# Patient Record
Sex: Female | Born: 1995
Health system: Southern US, Community
[De-identification: ages and names within clinical notes are randomized; demographics above are authoritative.]

## PROBLEM LIST (undated history)

## (undated) DIAGNOSIS — M1990 Unspecified osteoarthritis, unspecified site: Secondary | ICD-10-CM

## (undated) DIAGNOSIS — E785 Hyperlipidemia, unspecified: Secondary | ICD-10-CM

## (undated) DIAGNOSIS — F845 Asperger's syndrome: Secondary | ICD-10-CM

## (undated) DIAGNOSIS — N946 Dysmenorrhea, unspecified: Secondary | ICD-10-CM

## (undated) DIAGNOSIS — R519 Headache, unspecified: Secondary | ICD-10-CM

## (undated) DIAGNOSIS — R51 Headache: Secondary | ICD-10-CM

## (undated) DIAGNOSIS — L309 Dermatitis, unspecified: Secondary | ICD-10-CM

## (undated) DIAGNOSIS — N920 Excessive and frequent menstruation with regular cycle: Secondary | ICD-10-CM

## (undated) DIAGNOSIS — G2581 Restless legs syndrome: Secondary | ICD-10-CM

## (undated) HISTORY — DX: Unspecified osteoarthritis, unspecified site: M19.90

## (undated) HISTORY — DX: Excessive and frequent menstruation with regular cycle: N92.0

## (undated) HISTORY — DX: Headache, unspecified: R51.9

## (undated) HISTORY — DX: Dermatitis, unspecified: L30.9

## (undated) HISTORY — DX: Restless legs syndrome: G25.81

## (undated) HISTORY — DX: Asperger's syndrome: F84.5

## (undated) HISTORY — PX: TONSILECTOMY/ADENOIDECTOMY WITH MYRINGOTOMY: SHX6125

## (undated) HISTORY — DX: Dysmenorrhea, unspecified: N94.6

## (undated) HISTORY — DX: Headache: R51

## (undated) HISTORY — DX: Hyperlipidemia, unspecified: E78.5

---
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
## 2014-01-19 ENCOUNTER — Telehealth: Payer: Self-pay

## 2014-01-19 MED ORDER — NORETHIN-ETH ESTRAD-FE BIPHAS 1 MG-10 MCG / 10 MCG PO TABS: 1.0000 | ORAL_TABLET | Freq: Every day | ORAL | Status: DC

## 2014-01-19 NOTE — Telephone Encounter (Signed)
Received fax from [facility] re: refill on Lo Loestrin FE. Call to patient-scheduled AEX for 01/23/14 and sent 3 months refill so patient will have enough to get to appointment.//kn

## 2014-01-23 ENCOUNTER — Encounter: Payer: Self-pay | Admitting: Nurse Practitioner

## 2014-01-23 ENCOUNTER — Ambulatory Visit (INDEPENDENT_AMBULATORY_CARE_PROVIDER_SITE_OTHER): Payer: BC Managed Care – PPO | Admitting: Nurse Practitioner

## 2014-01-23 VITALS — BP 116/72 | HR 72 | Ht 63.75 in | Wt 212.0 lb

## 2014-01-23 DIAGNOSIS — Z01419 Encounter for gynecological examination (general) (routine) without abnormal findings: Secondary | ICD-10-CM

## 2014-01-23 DIAGNOSIS — R319 Hematuria, unspecified: Secondary | ICD-10-CM

## 2014-01-23 LAB — POCT URINALYSIS DIPSTICK
BILIRUBIN UA: NEGATIVE
GLUCOSE UA: NEGATIVE
Ketones, UA: NEGATIVE
Leukocytes, UA: NEGATIVE
NITRITE UA: NEGATIVE
Protein, UA: NEGATIVE
Urobilinogen, UA: NEGATIVE
pH, UA: 5

## 2014-01-23 MED ORDER — NORETHIN-ETH ESTRAD-FE BIPHAS 1 MG-10 MCG / 10 MCG PO TABS: 1.0000 | ORAL_TABLET | Freq: Every day | ORAL | Status: DC

## 2014-01-23 NOTE — Patient Instructions (Signed)
General topics  Next pap or exam is  due in 1 year Take a Women's multivitamin Take 1200 mg. of calcium daily - prefer dietary If any concerns in interim to call back  Breast Self-Awareness Practicing breast self-awareness may pick up problems early, prevent significant medical complications, and possibly save your life. By practicing breast self-awareness, you can become familiar with how your breasts look and feel and if your breasts are changing. This allows you to notice changes early. It can also offer you some reassurance that your breast health is good. One way to learn what is normal for your breasts and whether your breasts are changing is to do a breast self-exam. If you find a lump or something that was not present in the past, it is best to contact your caregiver right away. Other findings that should be evaluated by your caregiver include nipple discharge, especially if it is bloody; skin changes or reddening; areas where the skin seems to be pulled in (retracted); or new lumps and bumps. Breast pain is seldom associated with cancer (malignancy), but should also be evaluated by a caregiver. BREAST SELF-EXAM The best time to examine your breasts is 5 7 days after your menstrual period is over.  ExitCare Patient Information 2013 [employer].   Exercise to Stay Healthy Exercise helps you become and stay healthy. EXERCISE IDEAS AND TIPS Choose exercises that:  You enjoy.  Fit into your day. You do not need to exercise really hard to be healthy. You can do exercises at a slow or medium level and stay healthy. You can:  Stretch before and after working out.  Try yoga, Pilates, or tai chi.  Lift weights.  Walk fast, swim, jog, run, climb stairs, bicycle, dance, or rollerskate.  Take aerobic classes. Exercises that burn about 150 calories:  Running 1  miles in 15 minutes.  Playing volleyball for 45 to 60 minutes.  Washing and waxing a car for 45 to 60  minutes.  Playing touch football for 45 minutes.  Walking 1  miles in 35 minutes.  Pushing a stroller 1  miles in 30 minutes.  Playing basketball for 30 minutes.  Raking leaves for 30 minutes.  Bicycling 5 miles in 30 minutes.  Walking 2 miles in 30 minutes.  Dancing for 30 minutes.  Shoveling snow for 15 minutes.  Swimming laps for 20 minutes.  Walking up stairs for 15 minutes.  Bicycling 4 miles in 15 minutes.  Gardening for 30 to 45 minutes.  Jumping rope for 15 minutes.  Washing windows or floors for 45 to 60 minutes. Document Released: 05/24/2010 Document Revised: 07/14/2011 Document Reviewed: 05/24/2010 ExitCare Patient Information 2013 [employer].   Other topics ( that may be useful information):    Sexually Transmitted Disease Sexually transmitted disease (STD) refers to any infection that is passed from person to person during sexual activity. This may happen by way of saliva, semen, blood, vaginal mucus, or urine. Common STDs include:  Gonorrhea.  Chlamydia.  Syphilis.  HIV/AIDS.  Genital herpes.  Hepatitis B and C.  Trichomonas.  Human papillomavirus (HPV).  Pubic lice. CAUSES  An STD may be spread by bacteria, virus, or parasite. A person can get an STD by:  Sexual intercourse with an infected person.  Sharing sex toys with an infected person.  Sharing needles with an infected person.  Having intimate contact with the genitals, mouth, or rectal areas of an infected person. SYMPTOMS  Some people may not have any symptoms, but   they can still pass the infection to others. Different STDs have different symptoms. Symptoms include:  Painful or bloody urination.  Pain in the pelvis, abdomen, vagina, anus, throat, or eyes.  Skin rash, itching, irritation, growths, or sores (lesions). These usually occur in the genital or anal area.  Abnormal vaginal discharge.  Penile discharge in men.  Soft, flesh-colored skin growths in the  genital or anal area.  Fever.  Pain or bleeding during sexual intercourse.  Swollen glands in the groin area.  Yellow skin and eyes (jaundice). This is seen with hepatitis. DIAGNOSIS  To make a diagnosis, your caregiver may:  Take a medical history.  Perform a physical exam.  Take a specimen (culture) to be examined.  Examine a sample of discharge under a microscope.  Perform blood test TREATMENT   Chlamydia, gonorrhea, trichomonas, and syphilis can be cured with antibiotic medicine.  Genital herpes, hepatitis, and HIV can be treated, but not cured, with prescribed medicines. The medicines will lessen the symptoms.  Genital warts from HPV can be treated with medicine or by freezing, burning (electrocautery), or surgery. Warts may come back.  HPV is a virus and cannot be cured with medicine or surgery.However, abnormal areas may be followed very closely by your caregiver and may be removed from the cervix, vagina, or vulva through office procedures or surgery. If your diagnosis is confirmed, your recent sexual partners need treatment. This is true even if they are symptom-free or have a negative culture or evaluation. They should not have sex until their caregiver says it is okay. HOME CARE INSTRUCTIONS  All sexual partners should be informed, tested, and treated for all STDs.  Take your antibiotics as directed. Finish them even if you start to feel better.  Only take over-the-counter or prescription medicines for pain, discomfort, or fever as directed by your caregiver.  Rest.  Eat a balanced diet and drink enough fluids to keep your urine clear or pale yellow.  Do not have sex until treatment is completed and you have followed up with your caregiver. STDs should be checked after treatment.  Keep all follow-up appointments, Pap tests, and blood tests as directed by your caregiver.  Only use latex condoms and water-soluble lubricants during sexual activity. Do not use  petroleum jelly or oils.  Avoid alcohol and illegal drugs.  Get vaccinated for HPV and hepatitis. If you have not received these vaccines in the past, talk to your caregiver about whether one or both might be right for you.  Avoid risky sex practices that can break the skin. The only way to avoid getting an STD is to avoid all sexual activity.Latex condoms and dental dams (for oral sex) will help lessen the risk of getting an STD, but will not completely eliminate the risk. SEEK MEDICAL CARE IF:   You have a fever.  You have any new or worsening symptoms. Document Released: 07/12/2002 Document Revised: 07/14/2011 Document Reviewed: 07/19/2010 [facility] Patient Information 2013 [employer].    Domestic Abuse You are being battered or abused if someone close to you hits, pushes, or physically hurts you in any way. You also are being abused if you are forced into activities. You are being sexually abused if you are forced to have sexual contact of any kind. You are being emotionally abused if you are made to feel worthless or if you are constantly threatened. It is important to remember that help is available. No one has the right to abuse you. PREVENTION OF FURTHER  ABUSE  Learn the warning signs of danger. This varies with situations but may include: the use of alcohol, threats, isolation from friends and family, or forced sexual contact. Leave if you feel that violence is going to occur.  If you are attacked or beaten, report it to the police so the abuse is documented. You do not have to press charges. The police can protect you while you or the attackers are leaving. Get the officer's name and badge number and a copy of the report.  Find someone you can trust and tell them what is happening to you: your caregiver, a nurse, clergy member, close friend or family member. Feeling ashamed is natural, but remember that you have done nothing wrong. No one deserves abuse. Document Released:  04/18/2000 Document Revised: 07/14/2011 Document Reviewed: 06/27/2010 ExitCare Patient Information 2013 [employer].    How Much is Too Much Alcohol? Drinking too much alcohol can cause injury, accidents, and health problems. These types of problems can include:   Car crashes.  Falls.  Family fighting (domestic violence).  Drowning.  Fights.  Injuries.  Burns.  Damage to certain organs.  Having a baby with birth defects. ONE DRINK CAN BE TOO MUCH WHEN YOU ARE:  Working.  Pregnant or breastfeeding.  Taking medicines. Ask your doctor.  Driving or planning to drive. If you or someone you know has a drinking problem, get help from a doctor.  Document Released: 02/15/2009 Document Revised: 07/14/2011 Document Reviewed: 02/15/2009 ExitCare Patient Information 2013 [employer].   Smoking Hazards Smoking cigarettes is extremely bad for your health. Tobacco smoke has over 200 known poisons in it. There are over 60 chemicals in tobacco smoke that cause cancer. Some of the chemicals found in cigarette smoke include:   Cyanide.  Benzene.  Formaldehyde.  Methanol (wood alcohol).  Acetylene (fuel used in welding torches).  Ammonia. Cigarette smoke also contains the poisonous gases nitrogen oxide and carbon monoxide.  Cigarette smokers have an increased risk of many serious medical problems and Smoking causes approximately:  90% of all lung cancer deaths in men.  80% of all lung cancer deaths in women.  90% of deaths from chronic obstructive lung disease. Compared with nonsmokers, smoking increases the risk of:  Coronary heart disease by 2 to 4 times.  Stroke by 2 to 4 times.  Men developing lung cancer by 23 times.  Women developing lung cancer by 13 times.  Dying from chronic obstructive lung diseases by 12 times.  . Smoking is the most preventable cause of death and disease in our society.  WHY IS SMOKING ADDICTIVE?  Nicotine is the chemical  agent in tobacco that is capable of causing addiction or dependence.  When you smoke and inhale, nicotine is absorbed rapidly into the bloodstream through your lungs. Nicotine absorbed through the lungs is capable of creating a powerful addiction. Both inhaled and non-inhaled nicotine may be addictive.  Addiction studies of cigarettes and spit tobacco show that addiction to nicotine occurs mainly during the teen years, when young people begin using tobacco products. WHAT ARE THE BENEFITS OF QUITTING?  There are many health benefits to quitting smoking.   Likelihood of developing cancer and heart disease decreases. Health improvements are seen almost immediately.  Blood pressure, pulse rate, and breathing patterns start returning to normal soon after quitting. QUITTING SMOKING   American Lung Association - 1-800-LUNGUSA  American Cancer Society - 1-800-ACS-2345 Document Released: 05/29/2004 Document Revised: 07/14/2011 Document Reviewed: 01/31/2009 ExitCare Patient Information 2013 ExitCare[employer]   LLC.   Stress Management Stress is a state of physical or mental tension that often results from changes in your life or normal routine. Some common causes of stress are:  Death of a loved one.  Injuries or severe illnesses.  Getting fired or changing jobs.  Moving into a new home. Other causes may be:  Sexual problems.  Business or financial losses.  Taking on a large debt.  Regular conflict with someone at home or at work.  Constant tiredness from lack of sleep. It is not just bad things that are stressful. It may be stressful to:  Win the lottery.  Get married.  Buy a new car. The amount of stress that can be easily tolerated varies from person to person. Changes generally cause stress, regardless of the types of change. Too much stress can affect your health. It may lead to physical or emotional problems. Too little stress (boredom) may also become stressful. SUGGESTIONS TO  REDUCE STRESS:  Talk things over with your family and friends. It often is helpful to share your concerns and worries. If you feel your problem is serious, you may want to get help from a professional counselor.  Consider your problems one at a time instead of lumping them all together. Trying to take care of everything at once may seem impossible. List all the things you need to do and then start with the most important one. Set a goal to accomplish 2 or 3 things each day. If you expect to do too many in a single day you will naturally fail, causing you to feel even more stressed.  Do not use alcohol or drugs to relieve stress. Although you may feel better for a short time, they do not remove the problems that caused the stress. They can also be habit forming.  Exercise regularly - at least 3 times per week. Physical exercise can help to relieve that "uptight" feeling and will relax you.  The shortest distance between despair and hope is often a good night's sleep.  Go to bed and get up on time allowing yourself time for appointments without being rushed.  Take a short "time-out" period from any stressful situation that occurs during the day. Close your eyes and take some deep breaths. Starting with the muscles in your face, tense them, hold it for a few seconds, then relax. Repeat this with the muscles in your neck, shoulders, hand, stomach, back and legs.  Take good care of yourself. Eat a balanced diet and get plenty of rest.  Schedule time for having fun. Take a break from your daily routine to relax. HOME CARE INSTRUCTIONS   Call if you feel overwhelmed by your problems and feel you can no longer manage them on your own.  Return immediately if you feel like hurting yourself or someone else. Document Released: 10/15/2000 Document Revised: 07/14/2011 Document Reviewed: 06/07/2007 ExitCare Patient Information 2013 [employer]Care[employer].  

## 2014-01-23 NOTE — Progress Notes (Signed)
18 y.o. G0P0 Single Caucasian Fe here for annual exam. Usually has only spotting or no cycle on Lo Loestrin.  Some PMS and maybe slight cramps.  Not SA ever. Now on Mobic for plantar fascitis on right foot.  Patient's last menstrual period was 09/21/2013. occasionally a little spotting          Sexually active: No. never The current method of family planning is OCP (estrogen/progesterone).    Exercising: Yes.    Home exercise routine includes treadmill. Smoker:  no  Health Maintenance: Pap:  never TDaP:  Never will check with PCP Labs: 2013               UA: RBC-Trace, ph: 5.0   reports that she has never smoked. She has never used smokeless tobacco. She reports that she does not drink alcohol or use illicit drugs.  Past Medical History  Diagnosis Date  . Asperger's syndrome   . Menorrhagia   . Dysmenorrhea     Past Surgical History  Procedure Laterality Date  . Tonsilectomy/adenoidectomy with myringotomy    . Wisdom tooth extraction  09/02/12    Current Outpatient Prescriptions  Medication Sig Dispense Refill  . fexofenadine (ALLEGRA) 180 MG tablet Take 180 mg by mouth daily.        Marland Kitchen gabapentin (NEURONTIN) 600 MG tablet Take 600 mg by mouth 2 (two) times daily.       . Melatonin 5 MG TABS Take by mouth.      . meloxicam (MOBIC) 15 MG tablet Take 15 mg by mouth daily.      Marland Kitchen MINOCYCLINE HCL PO Take 50 mg by mouth daily.       . Multiple Vitamins-Minerals (MULTIVITAMIN WITH MINERALS) tablet Take 1 tablet by mouth daily.        . Norethindrone-Ethinyl Estradiol-Fe Biphas (LO LOESTRIN FE) 1 MG-10 MCG / 10 MCG tablet Take 1 tablet by mouth daily. Take continuous active pills.  4 Package  3  . Vilazodone HCl (VIIBRYD) 40 MG TABS Take 40 mg by mouth daily.       No current facility-administered medications for this visit.    Family History  Problem Relation Age of Onset  . Hypertension Father   . Thyroid disease Maternal Grandmother   . Depression Maternal Grandmother   .  Diabetes Maternal Grandfather   . CVA Maternal Grandfather   . Diabetes Paternal Grandmother   . Hypertension Paternal Grandmother   . Diabetes Paternal Grandfather   . Hypertension Paternal Grandfather   . Kidney Stones Mother   . Kidney Stones Sister     ROS:  Pertinent items are noted in HPI.  Otherwise, a comprehensive ROS was negative.  Exam:   BP 116/72  Pulse 72  Ht 5' 3.75" (1.619 m)  Wt 212 lb (96.163 kg)  BMI 36.69 kg/m2  LMP 09/21/2013 Height: 5' 3.75" (161.9 cm)  Ht Readings from Last 3 Encounters:  01/23/14 5' 3.75" (1.619 m)  12/20/12 5\' 4"  (1.626 m) (46%*, Z = -0.10)  02/10/11 5\' 4"  (1.626 m) (47%*, Z = -0.06)   * Growth percentiles are based on CDC 2-20 Years data.    General appearance: alert, cooperative and appears stated age Head: Normocephalic, without obvious abnormality, atraumatic Neck: no adenopathy, supple, symmetrical, trachea midline and thyroid normal to inspection and palpation Lungs: clear to auscultation bilaterally Breasts: normal appearance, no masses or tenderness Heart: regular rate and rhythm Abdomen: soft, non-tender; no masses,  no organomegaly Extremities: extremities normal, atraumatic,  no cyanosis or edema Skin: Skin color, texture, turgor normal. No rashes or lesions Lymph nodes: Cervical, supraclavicular, and axillary nodes normal. No abnormal inguinal nodes palpated Neurologic: Grossly normal   Pelvic: not indicated at this time  A:  Well Woman with normal exam  History of Asperger's Syndrome   History of Menorrhagia and Dysmenorrhea  Current amenorrhea on OCP   History of bad PMS and mood changes - debilitating but much better on OCP   Never Sexually active  Recent problems with right plantar fascitis   P:   Reviewed health and wellness pertinent to exam  Pap smear not taken today  Refill Lo Loestrin for a year  Counseled on breast self exam, use and side effects of OCP's, adequate intake of calcium and vitamin D,  diet and exercise return annually or prn  An After Visit Summary was printed and given to the patient.

## 2014-01-24 LAB — URINALYSIS, MICROSCOPIC ONLY
BACTERIA UA: NONE SEEN
CASTS: NONE SEEN
SQUAMOUS EPITHELIAL / LPF: NONE SEEN

## 2014-02-01 NOTE — Progress Notes (Signed)
Encounter reviewed by Dr. Brook Silva.  

## 2014-02-06 ENCOUNTER — Ambulatory Visit (INDEPENDENT_AMBULATORY_CARE_PROVIDER_SITE_OTHER): Payer: BC Managed Care – PPO | Admitting: Neurology

## 2014-02-06 ENCOUNTER — Encounter: Payer: Self-pay | Admitting: Neurology

## 2014-02-06 VITALS — BP 116/70 | HR 70 | Temp 98.3°F | Resp 16 | Ht 65.0 in | Wt 211.0 lb

## 2014-02-06 DIAGNOSIS — R42 Dizziness and giddiness: Secondary | ICD-10-CM

## 2014-02-06 DIAGNOSIS — H532 Diplopia: Secondary | ICD-10-CM

## 2014-02-06 DIAGNOSIS — R404 Transient alteration of awareness: Secondary | ICD-10-CM

## 2014-02-06 NOTE — Progress Notes (Signed)
NEUROLOGY CONSULTATION NOTE  Tina Wilcox MRN: [id number] DOB: 10-Jun-1995  Referring provider: Rowan Blase, PA-C Primary care provider: Sharilyn Sites, MD  Reason for consult:  Recurrent transient altered awareness, diplopia  HISTORY OF PRESENT ILLNESS: Tina Wilcox is a 18 year old right-handed woman with history of Asperger's syndrome, RLS, prior apnea, menorrhagia and dysmenorrhea who presents for dizziness.  Records reviewed.  For at least over a year, she began having spells, described as trouble focusing, memory problems, and aching in the shoulders.  On rare occasions, it is associated with left sided headache.  It is not associated with nausea or visual disturbance.  She always has phonophobia.  It lasts anywhere from 1 day to 1 week.  Frequency varies.  For the past 2 months, she began having similar spells while driving.  If she is driving for 40-98 minutes, she will develop a sensation of lightheadedness and sleepiness, associated with horizontal double vision.  It will occur until she is able to get out of the car and start moving around or interacting with people.  She does not lose awareness.  She also experiences episodes in the morning when she is half asleep and suddenly feels disoriented.  She denies episodes of loss of consciousness, seizure activity, cataplexy, or sleep paralysis.  She takes frequent naps but often has difficulty falling asleep at night.  She reports history of night terrors when she was a toddler.  She has a remote history of sleep apnea.  She denies history of seizures or head trauma.  Her mother and sister have history of migraines.  PAST MEDICAL HISTORY: Past Medical History  Diagnosis Date  . Asperger's syndrome   . Menorrhagia   . Dysmenorrhea   . Restless legs syndrome (RLS)   . Headache     PAST SURGICAL HISTORY: Past Surgical History  Procedure Laterality Date  . Tonsilectomy/adenoidectomy with myringotomy    . Wisdom tooth extraction   09/02/12    MEDICATIONS: Current Outpatient Prescriptions on File Prior to Visit  Medication Sig Dispense Refill  . fexofenadine (ALLEGRA) 180 MG tablet Take 180 mg by mouth daily.        Marland Kitchen gabapentin (NEURONTIN) 600 MG tablet Take 600 mg by mouth 2 (two) times daily.       . Melatonin 5 MG TABS Take by mouth.      . meloxicam (MOBIC) 15 MG tablet Take 15 mg by mouth daily.      Marland Kitchen MINOCYCLINE HCL PO Take 50 mg by mouth daily.       . Multiple Vitamins-Minerals (MULTIVITAMIN WITH MINERALS) tablet Take 1 tablet by mouth daily.        . Norethindrone-Ethinyl Estradiol-Fe Biphas (LO LOESTRIN FE) 1 MG-10 MCG / 10 MCG tablet Take 1 tablet by mouth daily. Take continuous active pills.  4 Package  3  . Vilazodone HCl (VIIBRYD) 40 MG TABS Take 40 mg by mouth daily.       No current facility-administered medications on file prior to visit.    ALLERGIES: No Known Allergies  FAMILY HISTORY: Family History  Problem Relation Age of Onset  . Hypertension Father   . Thyroid disease Maternal Grandmother   . Depression Maternal Grandmother   . Diabetes Maternal Grandfather   . CVA Maternal Grandfather   . Diabetes Paternal Grandmother   . Hypertension Paternal Grandmother   . Diabetes Paternal Grandfather   . Hypertension Paternal Grandfather   . Kidney Stones Mother   . Kidney Stones Sister  SOCIAL HISTORY: History   Social History  . Marital Status: Single    Spouse Name: Tina Wilcox    Number of Children: Tina Wilcox  . Years of Education: Tina Wilcox   Occupational History  . Not on file.   Social History Main Topics  . Smoking status: Never Smoker   . Smokeless tobacco: Never Used  . Alcohol Use: No  . Drug Use: No  . Sexual Activity: No   Other Topics Concern  . Not on file   Social History Narrative  . No narrative on file    REVIEW OF SYSTEMS: Constitutional: No fevers, chills, or sweats, no generalized fatigue, change in appetite Eyes: No visual changes, double vision, eye pain Ear,  nose and throat: No hearing loss, ear pain, nasal congestion, sore throat Cardiovascular: No chest pain, palpitations Respiratory:  No shortness of breath at rest or with exertion, wheezes GastrointestinaI: No nausea, vomiting, diarrhea, abdominal pain, fecal incontinence Genitourinary:  No dysuria, urinary retention or frequency Musculoskeletal:  No neck pain, back pain Integumentary: No rash, pruritus, skin lesions Neurological: as above Psychiatric: No depression, insomnia, anxiety Endocrine: No palpitations, fatigue, diaphoresis, mood swings, change in appetite, change in weight, increased thirst Hematologic/Lymphatic:  No anemia, purpura, petechiae. Allergic/Immunologic: no itchy/runny eyes, nasal congestion, recent allergic reactions, rashes  PHYSICAL EXAM: Filed Vitals:   02/06/14 1339  BP: 116/70  Pulse: 70  Temp: 98.3 F (36.8 C)  Resp: 16   General: No acute distress Head:  Normocephalic/atraumatic Neck: supple, no paraspinal tenderness, full range of motion Back: No paraspinal tenderness Heart: regular rate and rhythm Lungs: Clear to auscultation bilaterally. Vascular: No carotid bruits. Neurological Exam: Mental status: alert and oriented to person, place, and time, recent and remote memory intact, fund of knowledge intact, attention and concentration intact, speech fluent and not dysarthric, language intact. Cranial nerves: CN I: not tested CN II: pupils equal, round and reactive to light, visual fields intact, fundi unremarkable, without vessel changes, exudates, hemorrhages or papilledema. CN III, IV, VI:  full range of motion, no nystagmus, no ptosis CN V: facial sensation intact CN VII: upper and lower face symmetric CN VIII: hearing intact CN IX, X: gag intact, uvula midline CN XI: sternocleidomastoid and trapezius muscles intact CN XII: tongue midline Bulk & Tone: normal, no fasciculations. Motor: 5/5 throughout Sensation: temperature and vibration  intact Deep Tendon Reflexes: 2+ throughout, toes downgoing Finger to nose testing: no dysmetria Heel to shin: no dysmetria Gait: normal station and stride.  Able to turn and walk in tandem. Romberg negative.  IMPRESSION: Recurrent transient altered awareness Dizziness. Transient diplopia  Differential includes complex partial seizures or sleep disorder  PLAN: 1.  Will first check MRI of brain with and without contrast and EEG 2.  If unremarkable, will get sleep study  Thank you for allowing me to take part in the care of this patient.  Metta Clines, DO  CC:  Rowan Blase, PA-C  Sharilyn Sites, MD

## 2014-02-06 NOTE — Patient Instructions (Addendum)
1.  First, we will check MRI of the brain with and without contrast, and an EEG. MRI  @ [facility] 02/09/14 12:45 PM  2.  If these tests are unremarkable, then would send for a sleep study 3.  Follow up tentatively in 2 months or after testing done.

## 2014-02-09 ENCOUNTER — Ambulatory Visit (HOSPITAL_COMMUNITY)
Admission: RE | Admit: 2014-02-09 | Discharge: 2014-02-09 | Disposition: A | Discharge status: Home / Self Care | Payer: BC Managed Care – PPO | Source: Ambulatory Visit | Attending: Neurology | Admitting: Neurology

## 2014-02-09 DIAGNOSIS — R42 Dizziness and giddiness: Secondary | ICD-10-CM | POA: Diagnosis not present

## 2014-02-09 DIAGNOSIS — R404 Transient alteration of awareness: Secondary | ICD-10-CM

## 2014-02-09 DIAGNOSIS — H532 Diplopia: Secondary | ICD-10-CM | POA: Insufficient documentation

## 2014-02-09 MED ORDER — GADOBENATE DIMEGLUMINE 529 MG/ML IV SOLN
20.0000 mL | Freq: Once | INTRAVENOUS | Status: AC | PRN
  Administered 2014-02-09: 20 mL via INTRAVENOUS

## 2014-02-13 ENCOUNTER — Ambulatory Visit (INDEPENDENT_AMBULATORY_CARE_PROVIDER_SITE_OTHER): Payer: BC Managed Care – PPO | Admitting: Neurology

## 2014-02-13 DIAGNOSIS — R42 Dizziness and giddiness: Secondary | ICD-10-CM

## 2014-02-13 DIAGNOSIS — H532 Diplopia: Secondary | ICD-10-CM

## 2014-02-13 DIAGNOSIS — R404 Transient alteration of awareness: Secondary | ICD-10-CM

## 2014-02-14 NOTE — Procedures (Signed)
ELECTROENCEPHALOGRAM REPORT  Date of Study: 02/13/2014  Patient's Name: Tina Wilcox MRN: [id number] Date of Birth: October 11, 1995  Referring Provider: Metta Clines, DO  Indication: 18 year old woman with history of Asperger's Syndrome and restless leg syndrome who presents for recurrent episodes of transient altered awareness.  Medications: Gabapentin Melatonin vilazodone  Technical Summary: This is a multichannel digital EEG recording, using the international 10-20 placement system.  Spike detection software was employed.  Description: The EEG background is symmetric, with a well-developed posterior dominant rhythm of 11 Hz, which is reactive to eye opening and closing.  Diffuse beta activity is seen, with a bilateral frontal preponderance.  No focal or generalized abnormalities are seen.  No focal or generalized epileptiform discharges are seen.  Stage II sleep is seen, with normal and symmetric sleep patterns.  Hyperventilation and photic stimulation were performed, and produced no abnormalities.  ECG revealed normal cardiac rate and rhythm.  Impression: This is a normal routine EEG of the awake and asleep states, with activating procedures.  A normal study does not rule out the possibility of a seizure disorder in this patient.  Adam R. Tomi Likens, DO

## 2014-02-20 ENCOUNTER — Other Ambulatory Visit: Payer: Self-pay | Admitting: *Deleted

## 2014-02-20 DIAGNOSIS — R42 Dizziness and giddiness: Secondary | ICD-10-CM

## 2014-03-01 ENCOUNTER — Encounter: Payer: Self-pay | Admitting: Neurology

## 2014-03-01 ENCOUNTER — Ambulatory Visit (INDEPENDENT_AMBULATORY_CARE_PROVIDER_SITE_OTHER): Payer: BC Managed Care – PPO | Admitting: Neurology

## 2014-03-01 VITALS — BP 124/80 | HR 84 | Temp 98.0°F | Resp 14 | Ht 66.0 in | Wt 215.0 lb

## 2014-03-01 DIAGNOSIS — G4713 Recurrent hypersomnia: Secondary | ICD-10-CM

## 2014-03-01 DIAGNOSIS — G4701 Insomnia due to medical condition: Secondary | ICD-10-CM

## 2014-03-01 DIAGNOSIS — E669 Obesity, unspecified: Secondary | ICD-10-CM

## 2014-03-01 DIAGNOSIS — G4752 REM sleep behavior disorder: Secondary | ICD-10-CM

## 2014-03-01 DIAGNOSIS — F845 Asperger's syndrome: Secondary | ICD-10-CM

## 2014-03-01 DIAGNOSIS — G4759 Other parasomnia: Secondary | ICD-10-CM

## 2014-03-01 NOTE — Progress Notes (Signed)
NEUROLOGY CONSULTATION NOTE  Tina Wilcox MRN: [id number] DOB: 08/22/1995  Referring provider: Rowan Blase, PA-C Primary care provider: Sharilyn Sites, MD,  Pleas Koch, MD  Reason for consult:  Recurrent transient altered awareness, diplopia, abnormal sleep .  This patient of Dr.Jaffee's is referred for a sleep evaluation.   HISTORY OF PRESENT ILLNESS: Tina Wilcox is a 17 year old right-handed woman with history of Asperger's syndrome, RLS, prior apnea, menorrhagia and dysmenorrhea who presents for dizziness.   Tina Wilcox had been tested for RLS and mild sleep apnea in supine position. This study was obtained at age 25 , in [place].   Tina Wilcox and her mother report that Tina Wilcox had difficulties with sleep at a young age. She would have night terrors waking up searching for her parents of her right in front of her.  She was also at times insomnic- these seem to come in cycles or episodes. I called the patient, reports that she feels often as if not fully awake but no longer fully asleep. She is reporting having vivid dreams and ongoing story but she cannot truly concentrate on the details of the dream content as she feels too tired to do so.  Over this year her sleep has evolved : She seems not to be able to " get out of the dream"- I cannot focus on my dream and want to sleep. I feel frustrated by crazy and vivid, bizarre dreams that keep me from sleep. "  But she reports also , that she likes her dreams. She seems to drift off several times a day.  She reports that she naps most days, she did this while in school. But feels now it's a necessity. The naps can refresh her , most naps are lasting up to 2 hours. She has noted Bruxism and TMJ, snoring is reported .  Tina Wilcox goes to bed about midnight and sleeps until 8.30 AM. Sleep latency is usually 30 minutes, she takes melatonin. She avoids screen light in the bedroom.  Most nights she sleeps through until morning, no nocturia , no  confusional arousals, she finds herself in the same sleep position. She sleeps lateral and supine. She does not take caffeine. Takes regular breakfast , she is not exposed to a lot of daylight.  She is not an out of doors person.      Dr. Amaryllis Dyke note was reviewed:    For at least over a year, she began having spells, described as trouble focusing, memory problems, and aching in the shoulders.  On rare occasions, it is associated with left sided headache.  It is not associated with nausea or visual disturbance.  She always has phonophobia.  It lasts anywhere from 1 day to 1 week.  Frequency varies.  For the past 2 months, she began having similar spells while driving.  If she is driving for 33-00 minutes, she will develop a sensation of lightheadedness and sleepiness, associated with horizontal double vision.  It will occur until she is able to get out of the car and start moving around or interacting with people.  She does not lose awareness.  She also experiences episodes in the morning when she is half asleep and suddenly feels disoriented.  She denies episodes of loss of consciousness, seizure activity, cataplexy, or sleep paralysis.  She takes frequent naps but often has difficulty falling asleep at night.  She reports history of night terrors when she was a toddler.  She has a remote history  of sleep apnea.  She denies history of seizures or head trauma.  Her mother and sister have history of migraines.  PAST MEDICAL HISTORY: Past Medical History  Diagnosis Date  . Asperger's syndrome   . Menorrhagia   . Dysmenorrhea   . Restless legs syndrome (RLS)   . Headache     PAST SURGICAL HISTORY: Past Surgical History  Procedure Laterality Date  . Tonsilectomy/adenoidectomy with myringotomy    . Wisdom tooth extraction  09/02/12    MEDICATIONS: Current Outpatient Prescriptions on File Prior to Visit  Medication Sig Dispense Refill  . fexofenadine (ALLEGRA) 180 MG tablet Take 180 mg by  mouth daily.        Marland Kitchen gabapentin (NEURONTIN) 600 MG tablet Take 600 mg by mouth 2 (two) times daily.       . Melatonin 5 MG TABS Take 5 mg by mouth at bedtime. May take additional if needed.      Marland Kitchen MINOCYCLINE HCL PO Take 50 mg by mouth daily.       . Multiple Vitamins-Minerals (MULTIVITAMIN WITH MINERALS) tablet Take 1 tablet by mouth daily.        . Norethindrone-Ethinyl Estradiol-Fe Biphas (LO LOESTRIN FE) 1 MG-10 MCG / 10 MCG tablet Take 1 tablet by mouth daily. Take continuous active pills.  4 Package  3  . Vilazodone HCl (VIIBRYD) 40 MG TABS Take 40 mg by mouth daily.       No current facility-administered medications on file prior to visit.    ALLERGIES: No Known Allergies  FAMILY HISTORY: Family History  Problem Relation Age of Onset  . Hypertension Father   . Thyroid disease Maternal Grandmother   . Depression Maternal Grandmother   . Diabetes Maternal Grandfather   . CVA Maternal Grandfather   . Diabetes Paternal Grandmother   . Hypertension Paternal Grandmother   . Diabetes Paternal Grandfather   . Hypertension Paternal Grandfather   . Kidney Stones Mother   . Kidney Stones Sister     SOCIAL HISTORY: History   Social History  . Marital Status: Single    Spouse Name: N/A    Number of Children: N/A  . Years of Education: N/A   Occupational History  . Not on file.   Social History Main Topics  . Smoking status: Never Smoker   . Smokeless tobacco: Never Used  . Alcohol Use: No  . Drug Use: No  . Sexual Activity: No   Other Topics Concern  . Not on file   Social History Narrative   Right handed, Caffeine barely, HS grad.  Looking for work, Lives at home.      REVIEW OF SYSTEMS:  14 system : negative except for weight gain and insomnia.   PHYSICAL EXAM: Filed Vitals:   03/01/14 0930  BP: 124/80  Pulse: 84  Temp: 98 F (36.7 C)  Resp: 14   General: No acute distress Head:  Normocephalic/atraumatic Neck: supple, no paraspinal tenderness, full  range of motion,  No retrognathia ,   TMJ- wears a night guard.  Bruxism.  Neck circumference is  Back: No paraspinal tenderness Heart: regular rate and rhythm Lungs: Clear to auscultation bilaterally. Vascular: No carotid bruits. Neurological Exam: Mental status: alert and oriented to person, place, and time, recent and remote memory intact, fund of knowledge intact, attention and concentration intact, speech fluent and not dysarthric, language intact. Cranial nerves:  There appears to be  esotropia  (mild ) of the left eye with conjugate gaze, full extraocular  movements in the horizontal and vertical planes without nystagmus point prominent pupillary response to light. No pallor or edema noted. No facial asymmetry is noted, normal hearing, normal tongue and uvula midline movement.   Motor : equal tone and mass, no cog wheeling, normal strength.  Sensory intact to primary modalities.   Heel to shin/ finger to nose : no dysmetria Gait: normal station and stride.  Able to turn and walk in tandem.  Heel walk unsteady, but toe walk is normal, Romberg negative.  IMPRESSION:   1)episodic Insomnia and dream intrusion, obesity and large neck size- patient has risk factors for OSA. Her Epworth score of 15 is very high, her FSS is 47, elevated as well.  I am concerned about her report of dream intrusion.  Confusional arousals. Vivid dreams- these can be medication related.  20 RLS / PLM well managed on gabapentin.  3) Transient diplopia-  See report of EOM.   Differential includes complex partial seizures or sleep disorder.  PLAN: 1.  Will first check PSG with CO2, 2.  If unremarkable, will get  MSLT sleep study  Thank you for allowing me to take part in the care of this patient.  I will order a parasomnia PSG, SPLIT at  AHI 15 , 3%  Scored, CO2    CC:  Pleas Koch, MD  Rowan Blase, PA-C  Sharilyn Sites, MD

## 2014-04-04 ENCOUNTER — Telehealth: Payer: Self-pay | Admitting: Neurology

## 2014-04-04 NOTE — Telephone Encounter (Signed)
Pt called to cancel her f/u appt for 04/10/14. Pt wants to have her sleep study done first.

## 2014-04-10 ENCOUNTER — Ambulatory Visit: Payer: BC Managed Care – PPO | Admitting: Neurology

## 2014-04-16 ENCOUNTER — Ambulatory Visit (INDEPENDENT_AMBULATORY_CARE_PROVIDER_SITE_OTHER): Payer: BC Managed Care – PPO | Admitting: Neurology

## 2014-04-16 DIAGNOSIS — G4752 REM sleep behavior disorder: Secondary | ICD-10-CM

## 2014-04-16 DIAGNOSIS — G4759 Other parasomnia: Secondary | ICD-10-CM

## 2014-04-16 DIAGNOSIS — E669 Obesity, unspecified: Secondary | ICD-10-CM

## 2014-04-16 DIAGNOSIS — G4701 Insomnia due to medical condition: Secondary | ICD-10-CM

## 2014-04-16 DIAGNOSIS — F845 Asperger's syndrome: Secondary | ICD-10-CM

## 2014-04-16 DIAGNOSIS — G4713 Recurrent hypersomnia: Secondary | ICD-10-CM

## 2014-04-16 NOTE — Sleep Study (Signed)
Please see the scanned sleep study interpretation located in the Procedure tab within the Chart Review section. 

## 2014-04-25 ENCOUNTER — Encounter: Payer: Self-pay | Admitting: Neurology

## 2014-04-25 DIAGNOSIS — G4713 Recurrent hypersomnia: Secondary | ICD-10-CM | POA: Insufficient documentation

## 2014-04-26 ENCOUNTER — Encounter: Payer: Self-pay | Admitting: *Deleted

## 2014-04-26 ENCOUNTER — Telehealth: Payer: Self-pay | Admitting: *Deleted

## 2014-04-26 NOTE — Telephone Encounter (Signed)
Patient was contacted and provided the results of her NPSG and MSLT studies.  The patient gave verbal permission to mail a copy of her test results.  Copy of results were faxed to Dr. Pleas Koch.  Patient scheduled her follow-up appointment with Dr. Brett Fairy to go over treatment and results for 05/01/2014.

## 2014-05-01 ENCOUNTER — Ambulatory Visit (INDEPENDENT_AMBULATORY_CARE_PROVIDER_SITE_OTHER): Payer: BC Managed Care – PPO | Admitting: Neurology

## 2014-05-01 ENCOUNTER — Encounter: Payer: Self-pay | Admitting: Neurology

## 2014-05-01 VITALS — BP 113/76 | HR 71 | Resp 14 | Ht 66.0 in | Wt 218.0 lb

## 2014-05-01 DIAGNOSIS — F5105 Insomnia due to other mental disorder: Secondary | ICD-10-CM

## 2014-05-01 DIAGNOSIS — F419 Anxiety disorder, unspecified: Secondary | ICD-10-CM | POA: Insufficient documentation

## 2014-05-01 DIAGNOSIS — G4713 Recurrent hypersomnia: Secondary | ICD-10-CM | POA: Insufficient documentation

## 2014-05-01 DIAGNOSIS — G478 Other sleep disorders: Secondary | ICD-10-CM

## 2014-05-01 MED ORDER — MODAFINIL 100 MG PO TABS: 100.0000 mg | ORAL_TABLET | Freq: Every day | ORAL | Status: DC

## 2014-05-01 NOTE — Progress Notes (Signed)
NEUROLOGY CONSULTATION NOTE  Tina Wilcox MRN: [id number] DOB: 1995-05-18  Referring provider: Rowan Blase, PA-C Primary care provider: Sharilyn Sites, MD,  Tina Koch, MD  Reason for consult:  Recurrent transient altered awareness, diplopia, abnormal sleep .  This patient of Dr.Jaffee's is referred for a sleep evaluation.   HISTORY OF PRESENT ILLNESS: Tina Wilcox is a 18 year old right-handed woman with history of Asperger's syndrome, RLS, prior apnea, menorrhagia and dysmenorrhea who presents for dizziness.   MS. Tina Wilcox had been tested for RLS and mild sleep apnea in supine position. This study was obtained at age 74 , in [place].   Ms. Tina Wilcox and her mother report that Tina Wilcox had difficulties with sleep at a young age. She would have night terrors waking up searching for her parents of her right in front of her.  She was also at times insomnic- these seem to come in cycles or episodes. I called the patient, reports that she feels often as if not fully awake but no longer fully asleep. She is reporting having vivid dreams and ongoing story but she cannot truly concentrate on the details of the dream content as she feels too tired to do so.  Over this year her sleep has evolved : She seems not to be able to " get out of the dream"- I cannot focus on my dream and want to sleep. I feel frustrated by crazy and vivid, bizarre dreams that keep me from sleep. "  But she reports also , that she likes her dreams. She seems to drift off several times a day.  She reports that she naps most days, she did this while in school. But feels now it's a necessity. The naps can refresh her , most naps are lasting up to 2 hours. She has noted Bruxism and TMJ, snoring is reported .  Tina Wilcox goes to bed about midnight and sleeps until 8.30 AM. Sleep latency is usually 30 minutes, she takes melatonin. She avoids screen light in the bedroom.  Most nights she sleeps through until morning, no nocturia , no  confusional arousals, she finds herself in the same sleep position. She sleeps lateral and supine. She does not take caffeine. Takes regular breakfast , she is not exposed to a lot of daylight.  She is not an out of doors person.    05-01-14  The patient reports good success with Vybriid and has been more alert and less anxious. This may influence her sleep pattern, too.   Her sleep study was discussed- her PSG from  04-16-14 documented an AHI 4.6 and RDI of 7.2 . She has been sleepy all her life, her MSLT showed a mean sleep latency of 11 minutes and her REM latency was 210 minutes. Her MSLT was without any SREMs.  I will order a narcolepsy HLA, not change the antidepressants.  She want to drive and needs to take modafinil to be safe.   Dr. Amaryllis Wilcox note was reviewed:    For at least over a year, she began having spells, described as trouble focusing, memory problems, and aching in the shoulders.  Asperger's patient. On rare occasions, it is associated with left sided headache.  It is not associated with nausea or visual disturbance.  She always has phonophobia.  It lasts anywhere from 1 day to 1 week.  Frequency varies.  For the past 2 months, she began having similar spells while driving.  If she is driving for 09-62 minutes, she will develop  a sensation of lightheadedness and sleepiness, associated with horizontal double vision.  It will occur until she is able to get out of the car and start moving around or interacting with people.  She does not lose awareness.  She also experiences episodes in the morning when she is half asleep and suddenly feels disoriented.  She denies episodes of loss of consciousness, seizure activity, cataplexy, or sleep paralysis.  She takes frequent naps but often has difficulty falling asleep at night.  She reports history of night terrors when she was a toddler.  She has a remote history of sleep apnea.  She denies history of seizures or head trauma.  Her mother and  sister have history of migraines.  PAST MEDICAL HISTORY: Past Medical History  Diagnosis Date  . Asperger's syndrome   . Menorrhagia   . Dysmenorrhea   . Restless legs syndrome (RLS)   . Headache     PAST SURGICAL HISTORY: Past Surgical History  Procedure Laterality Date  . Tonsilectomy/adenoidectomy with myringotomy    . Wisdom tooth extraction  09/02/12    MEDICATIONS: Current Outpatient Prescriptions on File Prior to Visit  Medication Sig Dispense Refill  . cetirizine (ZYRTEC) 10 MG tablet Take 10 mg by mouth daily.    Marland Kitchen gabapentin (NEURONTIN) 600 MG tablet Take 600 mg by mouth 2 (two) times daily.     . Melatonin 5 MG TABS Take 5 mg by mouth at bedtime. May take additional if needed.    . Multiple Vitamins-Minerals (MULTIVITAMIN WITH MINERALS) tablet Take 1 tablet by mouth daily.      . Norethindrone-Ethinyl Estradiol-Fe Biphas (LO LOESTRIN FE) 1 MG-10 MCG / 10 MCG tablet Take 1 tablet by mouth daily. Take continuous active pills. 4 Package 3  . Vilazodone HCl (VIIBRYD) 40 MG TABS Take 40 mg by mouth daily.     No current facility-administered medications on file prior to visit.    ALLERGIES: No Known Allergies  FAMILY HISTORY: Family History  Problem Relation Age of Onset  . Hypertension Father   . Thyroid disease Maternal Grandmother   . Depression Maternal Grandmother   . Diabetes Maternal Grandfather   . CVA Maternal Grandfather   . Diabetes Paternal Grandmother   . Hypertension Paternal Grandmother   . Diabetes Paternal Grandfather   . Hypertension Paternal Grandfather   . Kidney Stones Mother   . Kidney Stones Sister     SOCIAL HISTORY: History   Social History  . Marital Status: Single    Spouse Name: N/A    Number of Children: N/A  . Years of Education: N/A   Occupational History  . Not on file.   Social History Main Topics  . Smoking status: Never Smoker   . Smokeless tobacco: Never Used  . Alcohol Use: No  . Drug Use: No  . Sexual  Activity: No   Other Topics Concern  . Not on file   Social History Narrative   Right handed, Caffeine barely, HS grad.  Looking for work, Lives at home.      REVIEW OF SYSTEMS:  14 system : negative except for weight gain and insomnia.   PHYSICAL EXAM: Filed Vitals:   05/01/14 0928  BP: 113/76  Pulse: 71  Resp: 14   General: No acute distress Head:  Normocephalic/atraumatic Neck: supple, no paraspinal tenderness, full range of motion,  No retrognathia ,   TMJ- wears a night guard.  Bruxism.  Neck circumference is  Back: No paraspinal tenderness Heart: regular  rate and rhythm Lungs: Clear to auscultation bilaterally. Vascular: No carotid bruits. Neurological Exam: Mental status: alert and oriented to person, place, and time, recent and remote memory intact, fund of knowledge intact, attention and concentration intact, speech fluent and not dysarthric, language intact. Cranial nerves:  There appears to be  esotropia  (mild ) of the left eye with conjugate gaze, full extraocular movements in the horizontal and vertical planes without nystagmus point prominent pupillary response to light. No pallor or edema noted. No facial asymmetry is noted, normal hearing, normal tongue and uvula midline movement.   Motor : equal tone and mass, no cog wheeling, normal strength.  Sensory intact to primary modalities.   Heel to shin/ finger to nose : no dysmetria Gait: normal station and stride.  Able to turn and walk in tandem.  Heel walk unsteady, but toe walk is normal, Romberg negative.  IMPRESSION:   1)episodic Insomnia and dream intrusion, obesity and large neck size- Her Epworth score of 13 is very high, her FSS is 47, elevated as well.  I am concerned about her report of dream intrusion. No OSA found.  Since her REM onset was delayed in the PSG and no SREMs seen in the MSLT. these can be medication related.   I will order the HLA test, and Modafinil would be my suggestion.  RV prn.        CC:  Tina Koch, MD  Tina Wilcox, [place], Tina Sites, MD

## 2014-05-01 NOTE — Patient Instructions (Signed)
Modafinil tablets  What is this medicine?  MODAFINIL (moe DAF i nil) is used to treat excessive sleepiness caused by certain sleep disorders. This includes narcolepsy, sleep apnea, and shift work sleep disorder.  This medicine may be used for other purposes; ask your health care provider or pharmacist if you have questions.  COMMON BRAND NAME(S): Provigil  What should I tell my health care provider before I take this medicine?  They need to know if you have any of these conditions:  -history of depression, mania, or other mental disorder  -kidney disease  -liver disease  -an unusual or allergic reaction to modafinil, other medicines, foods, dyes, or preservatives  -pregnant or trying to get pregnant  -breast-feeding  How should I use this medicine?  Take this medicine by mouth with a glass of water. Follow the directions on the prescription label. Take your doses at regular intervals. Do not take your medicine more often than directed. Do not stop taking except on your doctor's advice.  A special MedGuide will be given to you by the pharmacist with each prescription and refill. Be sure to read this information carefully each time.  Talk to your pediatrician regarding the use of this medicine in children. This medicine is not approved for use in children.  Overdosage: If you think you have taken too much of this medicine contact a poison control center or emergency room at once.  NOTE: This medicine is only for you. Do not share this medicine with others.  What if I miss a dose?  If you miss a dose, take it as soon as you can. If it is almost time for your next dose, take only that dose. Do not take double or extra doses.  What may interact with this medicine?  Do not take this medicine with any of the following medications:  -amphetamine or dextroamphetamine  -dexmethylphenidate or methylphenidate  -medicines called MAO Inhibitors like Nardil, Parnate, Marplan, Eldepryl  -pemoline  -procarbazine  This medicine may  also interact with the following medications:  -antifungal medicines like itraconazole or ketoconazole  -barbiturates like phenobarbital  -birth control pills or other hormone-containing birth control devices or implants  -carbamazepine  -cyclosporine  -diazepam  -medicines for depression, anxiety, or psychotic disturbances  -phenytoin  -propranolol  -triazolam  -warfarin  This list may not describe all possible interactions. Give your health care provider a list of all the medicines, herbs, non-prescription drugs, or dietary supplements you use. Also tell them if you smoke, drink alcohol, or use illegal drugs. Some items may interact with your medicine.  What should I watch for while using this medicine?  Visit your doctor or health care professional for regular checks on your progress. The full effects of this medicine may not be seen right away.  This medicine may affect your concentration, function, or may hide signs that you are tired. You may get dizzy. Do not drive, use machinery, or do anything that needs mental alertness until you know how this drug affects you. Alcohol can make you more dizzy and may interfere with your response to this medicine or your alertness. Avoid alcoholic drinks.  Birth control pills may not work properly while you are taking this medicine. Talk to your doctor about using an extra method of birth control.  It is unknown if the effects of this medicine will be increased by the use of caffeine. Caffeine is available in many foods, beverages, and medications. Ask your doctor if you should limit or   swelling of the face, lips, or tongue -anxiety -breathing problems -chest pain -fast, irregular  heartbeat -hallucinations -increased blood pressure -redness, blistering, peeling or loosening of the skin, including inside the mouth -sore throat, fever, or chills -suicidal thoughts or other mood changes -tremors -vomiting Side effects that usually do not require medical attention (report to your doctor or health care professional if they continue or are bothersome): -headache -nausea, diarrhea, or stomach upset -nervousness -trouble sleeping This list may not describe all possible side effects. Call your doctor for medical advice about side effects. You may report side effects to FDA at 1-800-FDA-1088. Where should I keep my medicine? Keep out of the reach of children. This medicine can be abused. Keep your medicine in a safe place to protect it from theft. Do not share this medicine with anyone. Selling or giving away this medicine is dangerous and against the law. Store at room temperature between 20 and 25 degrees C (68 and 77 degrees F). Throw away any unused medicine after the expiration date. NOTE: This sheet is a summary. It may not cover all possible information. If you have questions about this medicine, talk to your doctor, pharmacist, or health care provider.  2015, Elsevier/Gold Standard. (2009-04-03 21:07:42) Hypersomnia Hypersomnia usually brings recurrent episodes of excessive daytime sleepiness or prolonged nighttime sleep. It is different than feeling tired due to lack of or interrupted sleep at night. People with hypersomnia are compelled to nap repeatedly during the day. This is often at inappropriate times such as:  At work.  During a meal.  In conversation. These daytime naps usually provide no relief. This disorder typically affects adolescents and young adults. CAUSES  This condition may be caused by:  Another sleep disorder (such as narcolepsy or sleep apnea).  Dysfunction of the autonomic nervous system.  Drug or alcohol abuse.  A physical problem,  such as:  A tumor.  Head trauma. This is damage caused by an accident.  Injury to the central nervous system.  Certain medications, or medicine withdrawal.  Medical conditions may contribute to the disorder, including:  Multiple sclerosis.  Depression.  Encephalitis.  Epilepsy.  Obesity.  Some people appear to have a genetic predisposition to this disorder. In others, there is no known cause. SYMPTOMS   Patients often have difficulty waking from a long sleep. They may feel dazed or confused.  Other symptoms may include:  Anxiety.  Increased irritation (inflammation).  Decreased energy.  Restlessness.  Slow thinking.  Slow speech.  Loss of appetite.  Hallucinations.  Memory difficulty.  Tremors, Tics.  Some patients lose the ability to function in family, social, occupational, or other settings. TREATMENT  Treatment is symptomatic in nature. Stimulants and other drugs may be used to treat this disorder. Changes in behavior may help. For example, avoid night work and social activities that delay bed time. Changes in diet may offer some relief. Patients should avoid alcohol and caffeine. PROGNOSIS  The likely outcome (prognosis) for persons with hypersomnia depends on the cause of the disorder. The disorder itself is not life threatening. But it can have serious consequences. For example, automobile accidents can be caused by falling asleep while driving. The attacks usually continue indefinitely. Document Released: 04/11/2002 Document Revised: 07/14/2011 Document Reviewed: 03/15/2008 [facility] Patient Information 2015 [place], [place]. This information is not intended to replace advice given to you by your health care provider. Make sure you discuss any questions you have with your health care provider.

## 2014-05-04 ENCOUNTER — Telehealth: Payer: Self-pay | Admitting: Neurology

## 2014-05-04 NOTE — Telephone Encounter (Signed)
Patient's mother, Lelan Pons, stated prior authorization is needed for Rx modafinil (PROVIGIL) 100 MG tablet.  Please call and advise.

## 2014-05-04 NOTE — Telephone Encounter (Signed)
Ins has been contacted, and all clinical info was provided.  Request is currently under review.  I called back to advise.  They are aware.  Mom says the patient's insurance is changing to a different plan tomorrow, so we may have to initiate an additional request for new plan.  She will call us back and let us know if this needs to be done.

## 2014-05-08 ENCOUNTER — Telehealth: Payer: Self-pay | Admitting: Neurology

## 2014-05-08 NOTE — Telephone Encounter (Signed)
I contacted the pharmacy to get updated ins info.  The new info is as follows: BIN [id number] ID: [id number] Group: [id number] Phone [phone] I have contacted ins and provided all required clinical info.  Request is currently pending.  They will notify patient of outcome once decision has been made.  I called back.  They are aware.

## 2014-05-08 NOTE — Telephone Encounter (Signed)
Patient's mother stated prior authorization is needed for Rx modafinil (PROVIGIL) 100 MG tablet due to new [employer].  Please call and advise.

## 2014-05-11 ENCOUNTER — Telehealth: Payer: Self-pay | Admitting: *Deleted

## 2014-05-11 NOTE — Telephone Encounter (Signed)
I called and LMVM for her on cell# that results of lab was normal/negative.  She is to call back if questions.

## 2014-06-26 ENCOUNTER — Ambulatory Visit (INDEPENDENT_AMBULATORY_CARE_PROVIDER_SITE_OTHER): Payer: 59 | Admitting: Neurology

## 2014-06-26 ENCOUNTER — Encounter: Payer: Self-pay | Admitting: Neurology

## 2014-06-26 VITALS — BP 106/64 | HR 78 | Resp 16 | Ht 65.0 in | Wt 204.0 lb

## 2014-06-26 DIAGNOSIS — F32A Depression, unspecified: Secondary | ICD-10-CM | POA: Insufficient documentation

## 2014-06-26 DIAGNOSIS — F329 Major depressive disorder, single episode, unspecified: Secondary | ICD-10-CM

## 2014-06-26 DIAGNOSIS — R5383 Other fatigue: Secondary | ICD-10-CM

## 2014-06-26 NOTE — Progress Notes (Signed)
NEUROLOGY CONSULTATION NOTE  Tina Wilcox MRN: [id number] DOB: Feb 25, 1996  Referring provider: Rowan Blase, PA-C Primary care provider: Sharilyn Sites, MD,  Pleas Koch, DO neurologist at [facility]   Reason for consult:  Recurrent transient altered awareness, diplopia, abnormal sleep .  This patient of Dr.Jaffee's is referred for a sleep evaluation.   HISTORY OF PRESENT ILLNESS: Tina Wilcox is a 19 year old right-handed woman with history of Asperger's syndrome, RLS, prior apnea, menorrhagia and dysmenorrhea who presents for dizziness.   Tina Wilcox had been tested for RLS and mild sleep apnea in supine position. This study was obtained at age 89 , in [place].   Tina Wilcox and her mother report that Tina Wilcox had difficulties with sleep at a young age. She would have night terrors waking up searching for her parents of her right in front of her.  She was also at times insomnic- these seem to come in cycles or episodes. I called the patient, reports that she feels often as if not fully awake but no longer fully asleep. She is reporting having vivid dreams and ongoing story but she cannot truly concentrate on the details of the dream content as she feels too tired to do so.  Over this year her sleep has evolved : She seems not to be able to " get out of the dream"- I cannot focus on my dream and want to sleep. I feel frustrated by crazy and vivid, bizarre dreams that keep me from sleep. "  But she reports also , that she likes her dreams. She seems to drift off several times a day.  She reports that she naps most days, she did this while in school. But feels now it's a necessity. The naps can refresh her , most naps are lasting up to 2 hours. She has noted Bruxism and TMJ, snoring is reported .  Tina Wilcox goes to bed about midnight and sleeps until 8.30 AM. Sleep latency is usually 30 minutes, she takes melatonin. She avoids screen light in the bedroom.  Most nights she sleeps through until  morning, no nocturia , no confusional arousals, she finds herself in the same sleep position. She sleeps lateral and supine. She does not take caffeine. Takes regular breakfast , she is not exposed to a lot of daylight.  She is not an out of doors person.    05-01-14  The patient reports good success with Vybriid and has been more alert and less anxious. This may influence her sleep pattern, too.   Her sleep study was discussed- her PSG from  04-16-14 documented an AHI 4.6 and RDI of 7.2 . She has been sleepy all her life, her MSLT showed a mean sleep latency of 11 minutes and her REM latency was 210 minutes. Her MSLT was without any SREMs.  I will order a narcolepsy HLA, not change the antidepressants.  She want to drive and needs to take a stimulant modafinil to be safe.   06-26-14 Tina Wilcox is seen here today for a follow-up visit unfortunately the Provigil preparation at 100 mg daily did provoke headaches and also an anxiety / panic attack, she felt" a sense of doom". She had those before and was on Vibryd for this symptom. She felt hypervigilant at night, over 12 hors after taking it. She stopped.  She may tolerate a 50 mg dose, but unlikely to get the desired effect . HLA was negative , too. gabapentin is still used for restless legs.  8 PM and 12.00 midnight.   eliminating her morning dose helped with daytime sleepiness. The change to night time dose helps her night time sleep.   I see no other option for stimulant use in this patient with psychiatric overlay.     MEDICATIONS: Current Outpatient Prescriptions on File Prior to Visit  Medication Sig Dispense Refill  . cetirizine (ZYRTEC) 10 MG tablet Take 10 mg by mouth daily.    Marland Kitchen gabapentin (NEURONTIN) 600 MG tablet Take 600 mg by mouth 2 (two) times daily.     . Melatonin 5 MG TABS Take 5 mg by mouth at bedtime. May take additional if needed.    . modafinil (PROVIGIL) 100 MG tablet Take 1 tablet (100 mg total) by mouth daily.  30 tablet 1  . Multiple Vitamins-Minerals (MULTIVITAMIN WITH MINERALS) tablet Take 1 tablet by mouth daily.      . Norethindrone-Ethinyl Estradiol-Fe Biphas (LO LOESTRIN FE) 1 MG-10 MCG / 10 MCG tablet Take 1 tablet by mouth daily. Take continuous active pills. 4 Package 3  . Vilazodone HCl (VIIBRYD) 40 MG TABS Take 40 mg by mouth daily.    Marland Kitchen sulfamethoxazole-trimethoprim (BACTRIM,SEPTRA) 400-80 MG per tablet   1   No current facility-administered medications on file prior to visit.    ALLERGIES: No Known Allergies  FAMILY HISTORY: Family History  Problem Relation Age of Onset  . Hypertension Father   . Thyroid disease Maternal Grandmother   . Depression Maternal Grandmother   . Diabetes Maternal Grandfather   . CVA Maternal Grandfather   . Diabetes Paternal Grandmother   . Hypertension Paternal Grandmother   . Diabetes Paternal Grandfather   . Hypertension Paternal Grandfather   . Kidney Stones Mother   . Kidney Stones Sister     SOCIAL HISTORY: History   Social History  . Marital Status: Single    Spouse Name: N/A  . Number of Children: N/A  . Years of Education: N/A   Occupational History  . Not on file.   Social History Main Topics  . Smoking status: Never Smoker   . Smokeless tobacco: Never Used  . Alcohol Use: No  . Drug Use: No  . Sexual Activity: No   Other Topics Concern  . Not on file   Social History Narrative   Right handed, Caffeine barely, HS grad.  Looking for work, Lives at home.      REVIEW OF SYSTEMS:  14 system : negative except for weight gain and insomnia.   PHYSICAL EXAM: Filed Vitals:   06/26/14 1059  BP: 106/64  Pulse: 78  Resp: 16   General: No acute distress Head:  Normocephalic/atraumatic Neck: supple, no paraspinal tenderness, full range of motion,  No retrognathia ,   TMJ- wears a night guard.  Bruxism.  Neck circumference is  Back: No paraspinal tenderness Heart: regular rate and rhythm Lungs: Clear to auscultation  bilaterally. Vascular: No carotid bruits. Neurological Exam: Mental status: alert and oriented to person, place, and time, recent and remote memory intact, fund of knowledge intact, attention and concentration intact, speech fluent and not dysarthric, language intact. Cranial nerves:  There appears to be  esotropia  (mild ) of the left eye with conjugate gaze, full extraocular movements in the horizontal and vertical planes without nystagmus point prominent pupillary response to light. No pallor or edema noted. No facial asymmetry is noted, normal hearing, normal tongue and uvula midline movement.   Motor : equal tone and mass, no cog wheeling, normal strength.  Sensory intact to primary modalities.   Heel to shin/ finger to nose : no dysmetria Gait: normal station and stride.  Able to turn and walk in tandem.  Heel walk unsteady, but toe walk is normal, Romberg negative.  IMPRESSION:   1)episodic Insomnia and dream intrusion, obesity and large neck size- Her Epworth score of 13 is very high, her FSS is 47, elevated as well.  I am concerned about her report of dream intrusion. No OSA found.  Since her REM onset was delayed in the PSG and no SREMs seen in the MSLT. these can be medication related.   I will order the HLA test, and Modafinil would be my suggestion.  RV prn.       CC:  Pleas Koch, MD  Rowan Blase, [place], Sharilyn Sites, MD

## 2014-06-26 NOTE — Patient Instructions (Signed)
Autism Spectrum Disorder Autism spectrum disorder (ASD) is a neurodevelopmental disorder that starts in early childhood and is a lifelong problem. It is recognized by early onset of challenges a person has with communication, social interactions, and certain types of repeated behaviors. This disorder is also recognized by the effect these challenges have on daily activities. People living with ASD may also have other challenges, such as learning disabilities. Autism spectrum disorder usually becomes noticeable during early childhood. Some aspects of ASD are noticeable when a child is 75-12 months old. Most often, the challenges associated with this disorder are seen between the child's first and second birthdays (86-24 months old). The first signs of ASD are often seen by family members or health care providers. These signs are slow language development and a lack of interest in interacting with other people. Often after the child's first birthday, other aspects of ASD become noticeable. These include certain repeated behaviors and lack of typical play for the child's age. In most cases, people with ASD continue to learn how to cope with their disorder as they grow older.  SIGNS AND SYMPTOMS  There can be many different signs and symptoms of ASD, including:  Challenges with social communication and interaction with others.  Lack of interaction with other people.  Unusual approaches to interacting with people.  Lack of initiating (starting) social interactions with other people.  Lack of desire or ability to maintain eye contact with other people.  Lack of appropriate facial expressions.  Lack of appropriate body language while interacting with others.  Difficulty adjusting behavior when the situation calls for it.  Difficulties sharing imaginative play with others.  Difficulty making friends.  Repeated behaviors, interests, or activities.  Repeated movements like hand flapping, rocking  back and forth, or repeated head movements.  Often arranging items in an order that he or she desires or finds comforting.  Echoing what other people say.  Always wanting things to be the same, such as eating the same foods, taking the same route to school or work, or following the same order of activities each day.  Unusually strong attention to one thing or topic of interest.  Unusually strong or mild responses to experiences such as pain or extreme temperatures, touching certain textures, or smelling certain scents. Autism spectrum disorder occurs at different levels:  Level 1 is the least severe form of ASD. When supportive treatments are in place, most aspects of level 1 are difficult to notice. People at this level:  May speak in full sentences.  Usually have no repetitive behaviors.  May have trouble switching between activities.  May have trouble starting interactions or friendships with others.  Level 2 is a moderate form of ASD. At this level, challenges may be seen even when supportive treatments are in place. People at this level:  Speak in simple sentences.  Have difficulty coping with change.  Only interact with others around specific, shared interests.  Have unusual nonverbal communication skills.  Have repeated behaviors that sometimes interfere with daily activities.  Level 3 is the most severe form of ASD. Challenges at this level can interfere with daily life even when supportive treatments are in place. People at this level may:  Speak in very few understandable words.  Interact with others awkwardly and not very often.  Have trouble coping with change.  Have repeated behaviors that occur often and get in the way of their daily activities. Depending on the level of severity, some people are able to lead  normal or near-normal lives. Adolescence can worsen behavior problems in some children. Teenagers with ASD may become depressed. Some children develop  convulsions (seizures). People with ASD have a normal life span. DIAGNOSIS  The diagnosis of ASD is often a two-stage process. The first stage may occur during a checkup. Health care providers look for several signs. Early signs that your child's health care provider should look for during the 9-, 12-, and 66-month well-child visits include:  Lack of interest in other people, including adults and children.  Avoiding eye contact with others.  Inability to pay attention to something along with another person (impaired joint attention).  Not responding when his or her name is called.  Lack of pointing out or showing objects to another person. The second stage of diagnosis consists of in-depth screening by a team of specialists like psychologists, psychiatrists, neurologists, and speech therapists. This team of health care providers will perform tests such as:  Testing of your child's brain functions (neurologic testing).  Genetic testing.  Knowledge and language testing.  Verbal and nonverbal communication skills.  Ability to perform tasks on his or her own. TREATMENT  There is no single best treatment for ASD. While there is no cure, treatment helps to decrease how severe symptoms are and how much they interfere with a person's daily life. The best programs combine early and intensive treatment that is specific to the individual. Treatment should be ongoing and re-evaluated regularly. It is usually a combination of:   Systems analyst. This teaches the person with ASD to interact better with others. Parents can set an example of good behavior for their children and teach them how to recognize social cues.  Behavioral therapy. This can help to cut back on obsessive interests, emotional problems, and repetitive routines and behaviors.  Medicines. These may be used to treat depression and anxiety that sometimes occur alongside this disorder. Medicine may also be used for behavior or  hyperactivity problems. Seizures can be treated with medicine.  Physical therapy. This helps with poor coordination of the large muscles. Taking part in physical activities such as dance, gymnastics, or martial arts can also help. These activities allow the person to progress at his or her own pace, without the peer pressures found in team sports.  Occupational therapy. This helps with poor coordination of smaller muscles, such as muscles in the hands. It can also help when exposure to certain sounds or textures are especially bothersome to the person with ASD.  Speech therapy. This helps people who have trouble with their speech or conversations.  Family training and support. This helps family members learn how to manage behavioral issues and to cope with other challenges. Older children and teenagers may become sad when they realize they are different because of their disorder. Parents should be prepared to empathize with their child when this occurs. Support groups can be helpful. HOME CARE INSTRUCTIONS   Parents and family members need support to help deal with children with ASD. Support groups can often be found for families of ASD.  Children with this disorder often need help with social skills. Parents may need to teach things like how to:  Use eye contact.  Respect other people's personal spaces.  People with ASD respond well to routines for doing everyday things. Doing things like cooking, eating, or cleaning at the same time each day often works best.  Parents, teachers, and school counselors should meet regularly to make sure that they are taking the same  approach with a child who has this disorder. Meeting often helps to watch for problems and progress at school.  Teens and adults with ASD often need help as they work to become more independent. Support groups and counselors can provide encouragement and guidance on how to help a person with ASD expand his or her  independence.  Make sure any medicines that are prescribed are taken regularly and as directed.  Check with your health care provider before using any new prescription or over-the-counter medicines.  Keep all follow-up appointments with your health care provider. SEEK MEDICAL CARE IF:  Seek medical care if the person with ASD:  Has new symptoms that concern you.  Has a reaction to prescribed medicines.  Develops convulsions. Look for:  Jerking and twitching.  Sudden falls for no reason.  Lack of response.  Dazed behavior for brief periods.  Staring.  Rapid blinking.  Unusual sleepiness.  Irritability when waking.  Is depressed. Watch for unusual sadness, decreased appetite, weight loss, lack of interest in things that are normally enjoyed, or poor sleep.  Shows signs of anxiety. Watch for excessive worry, restlessness, irritability, trembling, or difficulty with sleep. Document Released: 01/11/2002 Document Revised: 09/05/2013 Document Reviewed: 01/21/2013 [facility] Patient Information 2015 [place], [place]. This information is not intended to replace advice given to you by your health care provider. Make sure you discuss any questions you have with your health care provider.

## 2014-07-27 ENCOUNTER — Telehealth: Payer: Self-pay | Admitting: Obstetrics & Gynecology

## 2014-07-27 NOTE — Telephone Encounter (Signed)
Patient's mom is calling, daughter has a cyst. Patient's mom would like an appointment ASAP. Patient has aex 01/25/15. DPR on file to talk with mom. Marie"

## 2014-07-27 NOTE — Telephone Encounter (Signed)
Spoke with Mother, Lelan Pons. States that patient has had L Breast Pain that has been "fairly regular" since January. Takes continuous ocp. No trauma to area. No mass, redness, swelling or streaking of area.  Offered appointment Monday 07/31/14 and Mother agreeable. Routing to provider for final review. Patient agreeable to disposition. Will close encounter

## 2014-07-31 ENCOUNTER — Ambulatory Visit (INDEPENDENT_AMBULATORY_CARE_PROVIDER_SITE_OTHER): Payer: 59 | Admitting: Nurse Practitioner

## 2014-07-31 ENCOUNTER — Encounter: Payer: Self-pay | Admitting: Nurse Practitioner

## 2014-07-31 VITALS — BP 122/64 | HR 72 | Temp 98.6°F | Resp 16 | Ht 65.0 in | Wt 203.0 lb

## 2014-07-31 DIAGNOSIS — N644 Mastodynia: Secondary | ICD-10-CM

## 2014-07-31 NOTE — Progress Notes (Signed)
   Subjective:   19 y.o. Single white female G0  presents for evaluation of left breast pain. Onset of the symptoms was in January.  Some times weekly or every few weeks.  Last 1/2 days.   Patient sought evaluation because of breast tenderness.  Her last menses on 3/12, with the last occurrence of breast pain on 3/18.  States this pain is not related to menses.  There are times when this pain occurs eery few days a week then may go for several weeks.  usually will occur for 1/2 day without any known reason.  She is on continuous active OCP with Lo Loestrin for treatment of menorrhagia and dysmenorrhea.  She has never been SA.  She also has diagnosis of Asperger's syndrome.  Contributing factors includes  [facility] of breast cancer for the paternal and maternal great aunt who were postmenopausal.   Denies anorexia, chills, fatigue, fevers, malaise and night sweats. Patient denies history of trauma, bites, or injuries. Last mammogram was: never due to age.   Previous evaluation has included:  None.    No pain of the right.  Wt loss of 15 lbs. since this pat fall.    Review of Systems Pertinent items are noted in HPI.   Objective:   @General  appearance: alert, cooperative, appears stated age and mildly obese Head: Normocephalic, without obvious abnormality, atraumatic Neck: no adenopathy, supple, symmetrical, trachea midline and thyroid not enlarged, symmetric, no tenderness/mass/nodules Back: negative, symmetric, no curvature. ROM normal. No CVA tenderness. Lungs: clear to auscultation bilaterally Breasts: normal appearance, no masses or tenderness, No nipple retraction or dimpling, No nipple discharge or bleeding, No axillary or supraclavicular adenopathy, Normal to palpation without dominant masses Heart: regular rate and rhythm Abdomen: normal findings: no masses palpable    Assessment:   ASSESSMENT:Patient is diagnosed with normal breast exam and FCB changes   Plan:   PLAN: The patient has a  documented plan to follow with further care of Left Breast Ultrasound on 08/04/14 at 9:30 am. 2. PLAN: FOLLOW as needed, or sooner, if symptoms worsen

## 2014-07-31 NOTE — Progress Notes (Signed)
Patient scheduled for L Breast Ultrasound at [place] appointment for 08/04/14 at 0930. Patient agreeable.

## 2014-08-02 NOTE — Progress Notes (Signed)
Encounter reviewed by Dr. Brook Silva.  

## 2015-01-25 ENCOUNTER — Ambulatory Visit: Payer: BC Managed Care – PPO | Admitting: Nurse Practitioner

## 2015-01-26 ENCOUNTER — Ambulatory Visit (INDEPENDENT_AMBULATORY_CARE_PROVIDER_SITE_OTHER): Payer: 59 | Admitting: Nurse Practitioner

## 2015-01-26 ENCOUNTER — Encounter: Payer: Self-pay | Admitting: Nurse Practitioner

## 2015-01-26 VITALS — BP 100/66 | HR 68 | Ht 64.75 in | Wt 190.0 lb

## 2015-01-26 DIAGNOSIS — Z0000 Encounter for general adult medical examination without abnormal findings: Secondary | ICD-10-CM

## 2015-01-26 DIAGNOSIS — Z01419 Encounter for gynecological examination (general) (routine) without abnormal findings: Secondary | ICD-10-CM | POA: Diagnosis not present

## 2015-01-26 MED ORDER — NORETHIN-ETH ESTRAD-FE BIPHAS 1 MG-10 MCG / 10 MCG PO TABS: 1.0000 | ORAL_TABLET | Freq: Every day | ORAL | Status: DC

## 2015-01-26 NOTE — Progress Notes (Signed)
Patient ID: Tina Wilcox, female   DOB: [birth year], 19 y.o.   MRN: [id number] 19 y.o. G0P0 Single  Caucasian Fe here for annual exam.  Menses was at every 3 months with only hormonal changes with moodiness.  But no menses.  Now going to monthly pack X 2 months still with light to spotting. Not SA or dating.  Patient's last menstrual period was 12/31/2014 (exact date).          Sexually active: No. never SA The current method of family planning is OCP (estrogen/progesterone) and abstinence.    Exercising: Yes.    treadmill x 1 hour each day, occasional weights Smoker:  no  Health Maintenance: Pap:  Never Gardasil:  None TDaP:  2015   Labs:  Dermatology    reports that she has never smoked. She has never used smokeless tobacco. She reports that she does not drink alcohol or use illicit drugs.  Past Medical History  Diagnosis Date  . Asperger's syndrome   . Menorrhagia   . Dysmenorrhea   . Restless legs syndrome (RLS)   . Headache   . Dermatitis     Past Surgical History  Procedure Laterality Date  . Tonsilectomy/adenoidectomy with myringotomy    . Wisdom tooth extraction  09/02/12    Current Outpatient Prescriptions  Medication Sig Dispense Refill  . Armodafinil 150 MG tablet Take 1 tablet by mouth as needed.    . Biotin 10 MG CAPS Take by mouth.    . cetirizine (ZYRTEC) 10 MG tablet Take 10 mg by mouth daily.    . cloNIDine (CATAPRES) 0.1 MG tablet Take 1 tablet by mouth as needed.    . gabapentin (NEURONTIN) 600 MG tablet Take 600 mg by mouth 2 (two) times daily.     Marland Kitchen lamoTRIgine (LAMICTAL) 25 MG tablet Take 1 tablet by mouth 2 (two) times daily.    . Melatonin 5 MG TABS Take 5 mg by mouth at bedtime. May take additional if needed.    . Multiple Vitamins-Minerals (MULTIVITAMIN WITH MINERALS) tablet Take 1 tablet by mouth daily.      . Norethindrone-Ethinyl Estradiol-Fe Biphas (LO LOESTRIN FE) 1 MG-10 MCG / 10 MCG tablet Take 1 tablet by mouth daily. Take continuous active  pills. 4 Package 3  . sulfamethoxazole-trimethoprim (BACTRIM DS,SEPTRA DS) 800-160 MG per tablet Take 1 tablet by mouth daily. with food  2  . Vilazodone HCl (VIIBRYD) 40 MG TABS Take 40 mg by mouth daily.    Marland Kitchen ZENATANE 30 MG capsule Take 2 capsules by mouth daily.  0   No current facility-administered medications for this visit.    Family History  Problem Relation Age of Onset  . Hypertension Father   . Thyroid disease Maternal Grandmother   . Depression Maternal Grandmother   . Diabetes Maternal Grandfather   . CVA Maternal Grandfather   . Diabetes Paternal Grandmother   . Hypertension Paternal Grandmother   . Diabetes Paternal Grandfather   . Hypertension Paternal Grandfather   . Kidney Stones Mother   . Kidney Stones Sister     ROS:  Pertinent items are noted in HPI.  Otherwise, a comprehensive ROS was negative.  Exam:   BP 100/66 mmHg  Pulse 68  Ht 5' 4.75" (1.645 m)  Wt 190 lb (86.183 kg)  BMI 31.85 kg/m2  LMP 12/31/2014 (Exact Date) Height: 5' 4.75" (164.5 cm) Ht Readings from Last 3 Encounters:  01/26/15 5' 4.75" (1.645 m)  07/31/14 5\' 5"  (1.651 m)  06/26/14 5'  5" (1.651 m)    General appearance: alert, cooperative and appears stated age Head: Normocephalic, without obvious abnormality, atraumatic Neck: no adenopathy, supple, symmetrical, trachea midline and thyroid normal to inspection and palpation Lungs: clear to auscultation bilaterally Breasts: normal appearance, no masses or tenderness Heart: regular rate and rhythm Abdomen: soft, non-tender; no masses,  no organomegaly Extremities: extremities normal, atraumatic, no cyanosis or edema Skin: Skin color, texture, turgor normal. No rashes or lesions Lymph nodes: Cervical, supraclavicular, and axillary nodes normal. No abnormal inguinal nodes palpated Neurologic: Grossly normal   Pelvic: External genitalia:  no lesions              Urethra:  normal appearing urethra with no masses, tenderness or  lesions              Bartholin's and Skene's: normal                 Vagina: normal appearing vagina with normal color and discharge, no lesions              Cervix: anteverted limited viability due to cooperation              Pap taken: Yes.   Bimanual Exam:  Uterus:  normal size, contour, position, consistency, mobility, non-tender              Adnexa: no mass, fullness, tenderness limited with 1 finger exam               Rectovaginal: Confirms               Anus:  normal sphincter tone, no lesions  Chaperone present: yes  A:  Well Woman with normal exam  History of Asperger's Syndrome  History of Menorrhagia and Dysmenorrhea Current amenorrhea on OCP  History of bad PMS and mood changes - debilitating but much better on OCP  Never Sexually active    P:   Reviewed health and wellness pertinent to exam  Pap smear as above  Refill OCP for a year  Counseled on breast self exam, STD prevention, HIV risk factors and prevention, use and side effects of OCP's, adequate intake of calcium and vitamin D, diet and exercise return annually or prn  An After Visit Summary was printed and given to the patient.

## 2015-01-26 NOTE — Patient Instructions (Signed)
General topics  Next pap or exam is  due in 1 year Take a Women's multivitamin Take 1200 mg. of calcium daily - prefer dietary If any concerns in interim to call back  Breast Self-Awareness Practicing breast self-awareness may pick up problems early, prevent significant medical complications, and possibly save your life. By practicing breast self-awareness, you can become familiar with how your breasts look and feel and if your breasts are changing. This allows you to notice changes early. It can also offer you some reassurance that your breast health is good. One way to learn what is normal for your breasts and whether your breasts are changing is to do a breast self-exam. If you find a lump or something that was not present in the past, it is best to contact your caregiver right away. Other findings that should be evaluated by your caregiver include nipple discharge, especially if it is bloody; skin changes or reddening; areas where the skin seems to be pulled in (retracted); or new lumps and bumps. Breast pain is seldom associated with cancer (malignancy), but should also be evaluated by a caregiver. BREAST SELF-EXAM The best time to examine your breasts is 5 7 days after your menstrual period is over.  ExitCare Patient Information 2013 [employer].   Exercise to Stay Healthy Exercise helps you become and stay healthy. EXERCISE IDEAS AND TIPS Choose exercises that:  You enjoy.  Fit into your day. You do not need to exercise really hard to be healthy. You can do exercises at a slow or medium level and stay healthy. You can:  Stretch before and after working out.  Try yoga, Pilates, or tai chi.  Lift weights.  Walk fast, swim, jog, run, climb stairs, bicycle, dance, or rollerskate.  Take aerobic classes. Exercises that burn about 150 calories:  Running 1  miles in 15 minutes.  Playing volleyball for 45 to 60 minutes.  Washing and waxing a car for 45 to 60  minutes.  Playing touch football for 45 minutes.  Walking 1  miles in 35 minutes.  Pushing a stroller 1  miles in 30 minutes.  Playing basketball for 30 minutes.  Raking leaves for 30 minutes.  Bicycling 5 miles in 30 minutes.  Walking 2 miles in 30 minutes.  Dancing for 30 minutes.  Shoveling snow for 15 minutes.  Swimming laps for 20 minutes.  Walking up stairs for 15 minutes.  Bicycling 4 miles in 15 minutes.  Gardening for 30 to 45 minutes.  Jumping rope for 15 minutes.  Washing windows or floors for 45 to 60 minutes. Document Released: 05/24/2010 Document Revised: 07/14/2011 Document Reviewed: 05/24/2010 ExitCare Patient Information 2013 [employer].   Other topics ( that may be useful information):    Sexually Transmitted Disease Sexually transmitted disease (STD) refers to any infection that is passed from person to person during sexual activity. This may happen by way of saliva, semen, blood, vaginal mucus, or urine. Common STDs include:  Gonorrhea.  Chlamydia.  Syphilis.  HIV/AIDS.  Genital herpes.  Hepatitis B and C.  Trichomonas.  Human papillomavirus (HPV).  Pubic lice. CAUSES  An STD may be spread by bacteria, virus, or parasite. A person can get an STD by:  Sexual intercourse with an infected person.  Sharing sex toys with an infected person.  Sharing needles with an infected person.  Having intimate contact with the genitals, mouth, or rectal areas of an infected person. SYMPTOMS  Some people may not have any symptoms, but   they can still pass the infection to others. Different STDs have different symptoms. Symptoms include:  Painful or bloody urination.  Pain in the pelvis, abdomen, vagina, anus, throat, or eyes.  Skin rash, itching, irritation, growths, or sores (lesions). These usually occur in the genital or anal area.  Abnormal vaginal discharge.  Penile discharge in men.  Soft, flesh-colored skin growths in the  genital or anal area.  Fever.  Pain or bleeding during sexual intercourse.  Swollen glands in the groin area.  Yellow skin and eyes (jaundice). This is seen with hepatitis. DIAGNOSIS  To make a diagnosis, your caregiver may:  Take a medical history.  Perform a physical exam.  Take a specimen (culture) to be examined.  Examine a sample of discharge under a microscope.  Perform blood test TREATMENT   Chlamydia, gonorrhea, trichomonas, and syphilis can be cured with antibiotic medicine.  Genital herpes, hepatitis, and HIV can be treated, but not cured, with prescribed medicines. The medicines will lessen the symptoms.  Genital warts from HPV can be treated with medicine or by freezing, burning (electrocautery), or surgery. Warts may come back.  HPV is a virus and cannot be cured with medicine or surgery.However, abnormal areas may be followed very closely by your caregiver and may be removed from the cervix, vagina, or vulva through office procedures or surgery. If your diagnosis is confirmed, your recent sexual partners need treatment. This is true even if they are symptom-free or have a negative culture or evaluation. They should not have sex until their caregiver says it is okay. HOME CARE INSTRUCTIONS  All sexual partners should be informed, tested, and treated for all STDs.  Take your antibiotics as directed. Finish them even if you start to feel better.  Only take over-the-counter or prescription medicines for pain, discomfort, or fever as directed by your caregiver.  Rest.  Eat a balanced diet and drink enough fluids to keep your urine clear or pale yellow.  Do not have sex until treatment is completed and you have followed up with your caregiver. STDs should be checked after treatment.  Keep all follow-up appointments, Pap tests, and blood tests as directed by your caregiver.  Only use latex condoms and water-soluble lubricants during sexual activity. Do not use  petroleum jelly or oils.  Avoid alcohol and illegal drugs.  Get vaccinated for HPV and hepatitis. If you have not received these vaccines in the past, talk to your caregiver about whether one or both might be right for you.  Avoid risky sex practices that can break the skin. The only way to avoid getting an STD is to avoid all sexual activity.Latex condoms and dental dams (for oral sex) will help lessen the risk of getting an STD, but will not completely eliminate the risk. SEEK MEDICAL CARE IF:   You have a fever.  You have any new or worsening symptoms. Document Released: 07/12/2002 Document Revised: 07/14/2011 Document Reviewed: 07/19/2010 [facility] Patient Information 2013 [employer].    Domestic Abuse You are being battered or abused if someone close to you hits, pushes, or physically hurts you in any way. You also are being abused if you are forced into activities. You are being sexually abused if you are forced to have sexual contact of any kind. You are being emotionally abused if you are made to feel worthless or if you are constantly threatened. It is important to remember that help is available. No one has the right to abuse you. PREVENTION OF FURTHER  ABUSE  Learn the warning signs of danger. This varies with situations but may include: the use of alcohol, threats, isolation from friends and family, or forced sexual contact. Leave if you feel that violence is going to occur.  If you are attacked or beaten, report it to the police so the abuse is documented. You do not have to press charges. The police can protect you while you or the attackers are leaving. Get the officer's name and badge number and a copy of the report.  Find someone you can trust and tell them what is happening to you: your caregiver, a nurse, clergy member, close friend or family member. Feeling ashamed is natural, but remember that you have done nothing wrong. No one deserves abuse. Document Released:  04/18/2000 Document Revised: 07/14/2011 Document Reviewed: 06/27/2010 ExitCare Patient Information 2013 [employer].    How Much is Too Much Alcohol? Drinking too much alcohol can cause injury, accidents, and health problems. These types of problems can include:   Car crashes.  Falls.  Family fighting (domestic violence).  Drowning.  Fights.  Injuries.  Burns.  Damage to certain organs.  Having a baby with birth defects. ONE DRINK CAN BE TOO MUCH WHEN YOU ARE:  Working.  Pregnant or breastfeeding.  Taking medicines. Ask your doctor.  Driving or planning to drive. If you or someone you know has a drinking problem, get help from a doctor.  Document Released: 02/15/2009 Document Revised: 07/14/2011 Document Reviewed: 02/15/2009 ExitCare Patient Information 2013 [employer].   Smoking Hazards Smoking cigarettes is extremely bad for your health. Tobacco smoke has over 200 known poisons in it. There are over 60 chemicals in tobacco smoke that cause cancer. Some of the chemicals found in cigarette smoke include:   Cyanide.  Benzene.  Formaldehyde.  Methanol (wood alcohol).  Acetylene (fuel used in welding torches).  Ammonia. Cigarette smoke also contains the poisonous gases nitrogen oxide and carbon monoxide.  Cigarette smokers have an increased risk of many serious medical problems and Smoking causes approximately:  90% of all lung cancer deaths in men.  80% of all lung cancer deaths in women.  90% of deaths from chronic obstructive lung disease. Compared with nonsmokers, smoking increases the risk of:  Coronary heart disease by 2 to 4 times.  Stroke by 2 to 4 times.  Men developing lung cancer by 23 times.  Women developing lung cancer by 13 times.  Dying from chronic obstructive lung diseases by 12 times.  . Smoking is the most preventable cause of death and disease in our society.  WHY IS SMOKING ADDICTIVE?  Nicotine is the chemical  agent in tobacco that is capable of causing addiction or dependence.  When you smoke and inhale, nicotine is absorbed rapidly into the bloodstream through your lungs. Nicotine absorbed through the lungs is capable of creating a powerful addiction. Both inhaled and non-inhaled nicotine may be addictive.  Addiction studies of cigarettes and spit tobacco show that addiction to nicotine occurs mainly during the teen years, when young people begin using tobacco products. WHAT ARE THE BENEFITS OF QUITTING?  There are many health benefits to quitting smoking.   Likelihood of developing cancer and heart disease decreases. Health improvements are seen almost immediately.  Blood pressure, pulse rate, and breathing patterns start returning to normal soon after quitting. QUITTING SMOKING   American Lung Association - 1-800-LUNGUSA  American Cancer Society - 1-800-ACS-2345 Document Released: 05/29/2004 Document Revised: 07/14/2011 Document Reviewed: 01/31/2009 ExitCare Patient Information 2013 ExitCare[employer]   LLC.   Stress Management Stress is a state of physical or mental tension that often results from changes in your life or normal routine. Some common causes of stress are:  Death of a loved one.  Injuries or severe illnesses.  Getting fired or changing jobs.  Moving into a new home. Other causes may be:  Sexual problems.  Business or financial losses.  Taking on a large debt.  Regular conflict with someone at home or at work.  Constant tiredness from lack of sleep. It is not just bad things that are stressful. It may be stressful to:  Win the lottery.  Get married.  Buy a new car. The amount of stress that can be easily tolerated varies from person to person. Changes generally cause stress, regardless of the types of change. Too much stress can affect your health. It may lead to physical or emotional problems. Too little stress (boredom) may also become stressful. SUGGESTIONS TO  REDUCE STRESS:  Talk things over with your family and friends. It often is helpful to share your concerns and worries. If you feel your problem is serious, you may want to get help from a professional counselor.  Consider your problems one at a time instead of lumping them all together. Trying to take care of everything at once may seem impossible. List all the things you need to do and then start with the most important one. Set a goal to accomplish 2 or 3 things each day. If you expect to do too many in a single day you will naturally fail, causing you to feel even more stressed.  Do not use alcohol or drugs to relieve stress. Although you may feel better for a short time, they do not remove the problems that caused the stress. They can also be habit forming.  Exercise regularly - at least 3 times per week. Physical exercise can help to relieve that "uptight" feeling and will relax you.  The shortest distance between despair and hope is often a good night's sleep.  Go to bed and get up on time allowing yourself time for appointments without being rushed.  Take a short "time-out" period from any stressful situation that occurs during the day. Close your eyes and take some deep breaths. Starting with the muscles in your face, tense them, hold it for a few seconds, then relax. Repeat this with the muscles in your neck, shoulders, hand, stomach, back and legs.  Take good care of yourself. Eat a balanced diet and get plenty of rest.  Schedule time for having fun. Take a break from your daily routine to relax. HOME CARE INSTRUCTIONS   Call if you feel overwhelmed by your problems and feel you can no longer manage them on your own.  Return immediately if you feel like hurting yourself or someone else. Document Released: 10/15/2000 Document Revised: 07/14/2011 Document Reviewed: 06/07/2007 ExitCare Patient Information 2013 [employer]Care[employer].  

## 2015-01-28 NOTE — Progress Notes (Signed)
Encounter reviewed by Dr. Brook Amundson C. Silva.  

## 2015-01-30 LAB — IPS PAP TEST WITH REFLEX TO HPV

## 2016-01-16 DIAGNOSIS — Z0271 Encounter for disability determination: Secondary | ICD-10-CM

## 2016-01-29 ENCOUNTER — Ambulatory Visit (INDEPENDENT_AMBULATORY_CARE_PROVIDER_SITE_OTHER): Payer: 59 | Admitting: Nurse Practitioner

## 2016-01-29 ENCOUNTER — Encounter: Payer: Self-pay | Admitting: Nurse Practitioner

## 2016-01-29 VITALS — BP 120/74 | HR 64 | Ht 64.75 in | Wt 205.0 lb

## 2016-01-29 DIAGNOSIS — Z01419 Encounter for gynecological examination (general) (routine) without abnormal findings: Secondary | ICD-10-CM | POA: Diagnosis not present

## 2016-01-29 DIAGNOSIS — Z0000 Encounter for general adult medical examination without abnormal findings: Secondary | ICD-10-CM

## 2016-01-29 MED ORDER — NORETHIN-ETH ESTRAD-FE BIPHAS 1 MG-10 MCG / 10 MCG PO TABS: 1.0000 | ORAL_TABLET | Freq: Every day | ORAL | 4 refills | Status: DC

## 2016-01-29 NOTE — Patient Instructions (Signed)
General topics  Next pap or exam is  due in 1 year Take a Women's multivitamin Take 1200 mg. of calcium daily - prefer dietary If any concerns in interim to call back  Breast Self-Awareness Practicing breast self-awareness may pick up problems early, prevent significant medical complications, and possibly save your life. By practicing breast self-awareness, you can become familiar with how your breasts look and feel and if your breasts are changing. This allows you to notice changes early. It can also offer you some reassurance that your breast health is good. One way to learn what is normal for your breasts and whether your breasts are changing is to do a breast self-exam. If you find a lump or something that was not present in the past, it is best to contact your caregiver right away. Other findings that should be evaluated by your caregiver include nipple discharge, especially if it is bloody; skin changes or reddening; areas where the skin seems to be pulled in (retracted); or new lumps and bumps. Breast pain is seldom associated with cancer (malignancy), but should also be evaluated by a caregiver. BREAST SELF-EXAM The best time to examine your breasts is 5 7 days after your menstrual period is over.  ExitCare Patient Information 2013 [employer].   Exercise to Stay Healthy Exercise helps you become and stay healthy. EXERCISE IDEAS AND TIPS Choose exercises that:  You enjoy.  Fit into your day. You do not need to exercise really hard to be healthy. You can do exercises at a slow or medium level and stay healthy. You can:  Stretch before and after working out.  Try yoga, Pilates, or tai chi.  Lift weights.  Walk fast, swim, jog, run, climb stairs, bicycle, dance, or rollerskate.  Take aerobic classes. Exercises that burn about 150 calories:  Running 1  miles in 15 minutes.  Playing volleyball for 45 to 60 minutes.  Washing and waxing a car for 45 to 60  minutes.  Playing touch football for 45 minutes.  Walking 1  miles in 35 minutes.  Pushing a stroller 1  miles in 30 minutes.  Playing basketball for 30 minutes.  Raking leaves for 30 minutes.  Bicycling 5 miles in 30 minutes.  Walking 2 miles in 30 minutes.  Dancing for 30 minutes.  Shoveling snow for 15 minutes.  Swimming laps for 20 minutes.  Walking up stairs for 15 minutes.  Bicycling 4 miles in 15 minutes.  Gardening for 30 to 45 minutes.  Jumping rope for 15 minutes.  Washing windows or floors for 45 to 60 minutes. Document Released: 05/24/2010 Document Revised: 07/14/2011 Document Reviewed: 05/24/2010 ExitCare Patient Information 2013 [employer].   Other topics ( that may be useful information):    Sexually Transmitted Disease Sexually transmitted disease (STD) refers to any infection that is passed from person to person during sexual activity. This may happen by way of saliva, semen, blood, vaginal mucus, or urine. Common STDs include:  Gonorrhea.  Chlamydia.  Syphilis.  HIV/AIDS.  Genital herpes.  Hepatitis B and C.  Trichomonas.  Human papillomavirus (HPV).  Pubic lice. CAUSES  An STD may be spread by bacteria, virus, or parasite. A person can get an STD by:  Sexual intercourse with an infected person.  Sharing sex toys with an infected person.  Sharing needles with an infected person.  Having intimate contact with the genitals, mouth, or rectal areas of an infected person. SYMPTOMS  Some people may not have any symptoms, but   they can still pass the infection to others. Different STDs have different symptoms. Symptoms include:  Painful or bloody urination.  Pain in the pelvis, abdomen, vagina, anus, throat, or eyes.  Skin rash, itching, irritation, growths, or sores (lesions). These usually occur in the genital or anal area.  Abnormal vaginal discharge.  Penile discharge in men.  Soft, flesh-colored skin growths in the  genital or anal area.  Fever.  Pain or bleeding during sexual intercourse.  Swollen glands in the groin area.  Yellow skin and eyes (jaundice). This is seen with hepatitis. DIAGNOSIS  To make a diagnosis, your caregiver may:  Take a medical history.  Perform a physical exam.  Take a specimen (culture) to be examined.  Examine a sample of discharge under a microscope.  Perform blood test TREATMENT   Chlamydia, gonorrhea, trichomonas, and syphilis can be cured with antibiotic medicine.  Genital herpes, hepatitis, and HIV can be treated, but not cured, with prescribed medicines. The medicines will lessen the symptoms.  Genital warts from HPV can be treated with medicine or by freezing, burning (electrocautery), or surgery. Warts may come back.  HPV is a virus and cannot be cured with medicine or surgery.However, abnormal areas may be followed very closely by your caregiver and may be removed from the cervix, vagina, or vulva through office procedures or surgery. If your diagnosis is confirmed, your recent sexual partners need treatment. This is true even if they are symptom-free or have a negative culture or evaluation. They should not have sex until their caregiver says it is okay. HOME CARE INSTRUCTIONS  All sexual partners should be informed, tested, and treated for all STDs.  Take your antibiotics as directed. Finish them even if you start to feel better.  Only take over-the-counter or prescription medicines for pain, discomfort, or fever as directed by your caregiver.  Rest.  Eat a balanced diet and drink enough fluids to keep your urine clear or pale yellow.  Do not have sex until treatment is completed and you have followed up with your caregiver. STDs should be checked after treatment.  Keep all follow-up appointments, Pap tests, and blood tests as directed by your caregiver.  Only use latex condoms and water-soluble lubricants during sexual activity. Do not use  petroleum jelly or oils.  Avoid alcohol and illegal drugs.  Get vaccinated for HPV and hepatitis. If you have not received these vaccines in the past, talk to your caregiver about whether one or both might be right for you.  Avoid risky sex practices that can break the skin. The only way to avoid getting an STD is to avoid all sexual activity.Latex condoms and dental dams (for oral sex) will help lessen the risk of getting an STD, but will not completely eliminate the risk. SEEK MEDICAL CARE IF:   You have a fever.  You have any new or worsening symptoms. Document Released: 07/12/2002 Document Revised: 07/14/2011 Document Reviewed: 07/19/2010 [facility] Patient Information 2013 [employer].    Domestic Abuse You are being battered or abused if someone close to you hits, pushes, or physically hurts you in any way. You also are being abused if you are forced into activities. You are being sexually abused if you are forced to have sexual contact of any kind. You are being emotionally abused if you are made to feel worthless or if you are constantly threatened. It is important to remember that help is available. No one has the right to abuse you. PREVENTION OF FURTHER  ABUSE  Learn the warning signs of danger. This varies with situations but may include: the use of alcohol, threats, isolation from friends and family, or forced sexual contact. Leave if you feel that violence is going to occur.  If you are attacked or beaten, report it to the police so the abuse is documented. You do not have to press charges. The police can protect you while you or the attackers are leaving. Get the officer's name and badge number and a copy of the report.  Find someone you can trust and tell them what is happening to you: your caregiver, a nurse, clergy member, close friend or family member. Feeling ashamed is natural, but remember that you have done nothing wrong. No one deserves abuse. Document Released:  04/18/2000 Document Revised: 07/14/2011 Document Reviewed: 06/27/2010 ExitCare Patient Information 2013 [employer].    How Much is Too Much Alcohol? Drinking too much alcohol can cause injury, accidents, and health problems. These types of problems can include:   Car crashes.  Falls.  Family fighting (domestic violence).  Drowning.  Fights.  Injuries.  Burns.  Damage to certain organs.  Having a baby with birth defects. ONE DRINK CAN BE TOO MUCH WHEN YOU ARE:  Working.  Pregnant or breastfeeding.  Taking medicines. Ask your doctor.  Driving or planning to drive. If you or someone you know has a drinking problem, get help from a doctor.  Document Released: 02/15/2009 Document Revised: 07/14/2011 Document Reviewed: 02/15/2009 ExitCare Patient Information 2013 [employer].   Smoking Hazards Smoking cigarettes is extremely bad for your health. Tobacco smoke has over 200 known poisons in it. There are over 60 chemicals in tobacco smoke that cause cancer. Some of the chemicals found in cigarette smoke include:   Cyanide.  Benzene.  Formaldehyde.  Methanol (wood alcohol).  Acetylene (fuel used in welding torches).  Ammonia. Cigarette smoke also contains the poisonous gases nitrogen oxide and carbon monoxide.  Cigarette smokers have an increased risk of many serious medical problems and Smoking causes approximately:  90% of all lung cancer deaths in men.  80% of all lung cancer deaths in women.  90% of deaths from chronic obstructive lung disease. Compared with nonsmokers, smoking increases the risk of:  Coronary heart disease by 2 to 4 times.  Stroke by 2 to 4 times.  Men developing lung cancer by 23 times.  Women developing lung cancer by 13 times.  Dying from chronic obstructive lung diseases by 12 times.  . Smoking is the most preventable cause of death and disease in our society.  WHY IS SMOKING ADDICTIVE?  Nicotine is the chemical  agent in tobacco that is capable of causing addiction or dependence.  When you smoke and inhale, nicotine is absorbed rapidly into the bloodstream through your lungs. Nicotine absorbed through the lungs is capable of creating a powerful addiction. Both inhaled and non-inhaled nicotine may be addictive.  Addiction studies of cigarettes and spit tobacco show that addiction to nicotine occurs mainly during the teen years, when young people begin using tobacco products. WHAT ARE THE BENEFITS OF QUITTING?  There are many health benefits to quitting smoking.   Likelihood of developing cancer and heart disease decreases. Health improvements are seen almost immediately.  Blood pressure, pulse rate, and breathing patterns start returning to normal soon after quitting. QUITTING SMOKING   American Lung Association - 1-800-LUNGUSA  American Cancer Society - 1-800-ACS-2345 Document Released: 05/29/2004 Document Revised: 07/14/2011 Document Reviewed: 01/31/2009 ExitCare Patient Information 2013 ExitCare[employer]   LLC.   Stress Management Stress is a state of physical or mental tension that often results from changes in your life or normal routine. Some common causes of stress are:  Death of a loved one.  Injuries or severe illnesses.  Getting fired or changing jobs.  Moving into a new home. Other causes may be:  Sexual problems.  Business or financial losses.  Taking on a large debt.  Regular conflict with someone at home or at work.  Constant tiredness from lack of sleep. It is not just bad things that are stressful. It may be stressful to:  Win the lottery.  Get married.  Buy a new car. The amount of stress that can be easily tolerated varies from person to person. Changes generally cause stress, regardless of the types of change. Too much stress can affect your health. It may lead to physical or emotional problems. Too little stress (boredom) may also become stressful. SUGGESTIONS TO  REDUCE STRESS:  Talk things over with your family and friends. It often is helpful to share your concerns and worries. If you feel your problem is serious, you may want to get help from a professional counselor.  Consider your problems one at a time instead of lumping them all together. Trying to take care of everything at once may seem impossible. List all the things you need to do and then start with the most important one. Set a goal to accomplish 2 or 3 things each day. If you expect to do too many in a single day you will naturally fail, causing you to feel even more stressed.  Do not use alcohol or drugs to relieve stress. Although you may feel better for a short time, they do not remove the problems that caused the stress. They can also be habit forming.  Exercise regularly - at least 3 times per week. Physical exercise can help to relieve that "uptight" feeling and will relax you.  The shortest distance between despair and hope is often a good night's sleep.  Go to bed and get up on time allowing yourself time for appointments without being rushed.  Take a short "time-out" period from any stressful situation that occurs during the day. Close your eyes and take some deep breaths. Starting with the muscles in your face, tense them, hold it for a few seconds, then relax. Repeat this with the muscles in your neck, shoulders, hand, stomach, back and legs.  Take good care of yourself. Eat a balanced diet and get plenty of rest.  Schedule time for having fun. Take a break from your daily routine to relax. HOME CARE INSTRUCTIONS   Call if you feel overwhelmed by your problems and feel you can no longer manage them on your own.  Return immediately if you feel like hurting yourself or someone else. Document Released: 10/15/2000 Document Revised: 07/14/2011 Document Reviewed: 06/07/2007 ExitCare Patient Information 2013 [employer]Care[employer].  

## 2016-01-29 NOTE — Progress Notes (Signed)
Patient ID: Tina Wilcox, female   DOB: June 27, 1995, 19 y.o.   MRN: [id number]  20 y.o. G0P0000 Single  Caucasian Fe here for annual exam.  Currently not in school.  Now on continuous active without having a cycle since March. Now when she gets spotting only for 3-4 days.  Not dating.  No new diagnosis since last year.  Patient's last menstrual period was 07/17/2015 (exact date).          Sexually active: No.  The current method of family planning is OCP (estrogen/progesterone).    Exercising: Yes.    treadmill, weights Smoker:  no  Health Maintenance: Pap: 01/26/15, Negative  Gardasil:  None TDaP:  Up to date, per pt.   HIV: will consider for later date  Labs: PCP  Urine: Negative    reports that she has never smoked. She has never used smokeless tobacco. She reports that she does not drink alcohol or use drugs.  Past Medical History:  Diagnosis Date  . Asperger's syndrome   . Dermatitis   . Dysmenorrhea   . Headache   . Menorrhagia   . Restless legs syndrome (RLS)     Past Surgical History:  Procedure Laterality Date  . TONSILECTOMY/ADENOIDECTOMY WITH MYRINGOTOMY    . WISDOM TOOTH EXTRACTION  09/02/12    Current Outpatient Prescriptions  Medication Sig Dispense Refill  . Biotin 10 MG CAPS Take by mouth.    . cetirizine (ZYRTEC) 10 MG tablet Take 10 mg by mouth daily.    . cloNIDine (CATAPRES) 0.1 MG tablet Take 1 tablet by mouth as needed.    . gabapentin (NEURONTIN) 600 MG tablet Take 600 mg by mouth 2 (two) times daily.     Marland Kitchen LAMICTAL 100 MG tablet Take 1.5 tablets by mouth daily.    Marland Kitchen lamoTRIgine (LAMICTAL) 25 MG tablet Take 1 tablet by mouth 2 (two) times daily.    . Melatonin 10 MG CAPS Take 1 tablet by mouth at bedtime.    . Multiple Vitamins-Minerals (MULTIVITAMIN WITH MINERALS) tablet Take 1 tablet by mouth daily.      . Norethindrone-Ethinyl Estradiol-Fe Biphas (LO LOESTRIN FE) 1 MG-10 MCG / 10 MCG tablet Take 1 tablet by mouth daily. Take continuous active pills. 4  Package 3  . Vilazodone HCl (VIIBRYD) 40 MG TABS Take 40 mg by mouth daily.     No current facility-administered medications for this visit.     Family History  Problem Relation Age of Onset  . Hypertension Father   . Kidney Stones Mother   . Kidney Stones Sister   . Thyroid disease Maternal Grandmother   . Depression Maternal Grandmother   . Diabetes Maternal Grandfather   . CVA Maternal Grandfather   . Cancer Maternal Grandfather     unknown primary, ? melanoma  . Diabetes Paternal Grandmother   . Hypertension Paternal Grandmother   . Diabetes Paternal Grandfather   . Hypertension Paternal Grandfather   . Cancer Paternal Grandfather     leukemia    ROS:  Pertinent items are noted in HPI.  Otherwise, a comprehensive ROS was negative.  Exam:   BP 120/74 (BP Location: Right Arm, Patient Position: Sitting, Cuff Size: Large)   Pulse 64   Ht 5' 4.75" (1.645 m)   Wt 205 lb (93 kg)   LMP 07/17/2015 (Exact Date) Comment: continuous OCP  BMI 34.38 kg/m  Height: 5' 4.75" (164.5 cm) Ht Readings from Last 3 Encounters:  01/29/16 5' 4.75" (1.645 m)  01/26/15 5'  4.75" (1.645 m)  07/31/14 5\' 5"  (1.651 m)    General appearance: alert, cooperative and appears stated age Head: Normocephalic, without obvious abnormality, atraumatic Neck: no adenopathy, supple, symmetrical, trachea midline and thyroid normal to inspection and palpation Lungs: clear to auscultation bilaterally Breasts: normal appearance, no masses or tenderness Heart: regular rate and rhythm Abdomen: soft, non-tender; no masses,  no organomegaly Extremities: extremities normal, atraumatic, no cyanosis or edema Skin: Skin color, texture, turgor normal. No rashes or lesions Lymph nodes: Cervical, supraclavicular, and axillary nodes normal. No abnormal inguinal nodes palpated Neurologic: Grossly normal   Pelvic: External genitalia:  no lesions              Urethra:  normal appearing urethra with no masses, tenderness  or lesions              Bartholin's and Skene's: normal                 Vagina: normal appearing vagina with normal color and discharge, no lesions              Cervix: anteverted              Pap taken: No. Bimanual Exam:  Uterus:  normal size, contour, position, consistency, mobility, non-tender              Adnexa: no mass, fullness, tenderness               Rectovaginal: deferred               Anus: deferred  Chaperone present: no  A:  Well Woman with normal exam  History of Asperger's Syndrome  History of Menorrhagia and Dysmenorrhea Current amenorrhea on OCP  History of bad PMS and mood changes - debilitating but much better on OCP  Never Sexually active  P:   Reviewed health and wellness pertinent to exam  Pap smear as above  Refill OCP for continuous active x 1 year  Counseled on breast self exam, STD prevention, HIV risk factors and prevention, use and side effects of OCP's, adequate intake of calcium and vitamin D, diet and exercise return annually or prn  An After Visit Summary was printed and given to the patient.

## 2016-01-31 NOTE — Progress Notes (Signed)
Encounter reviewed by Dr. Arieal Cuoco Amundson C. Silva.  

## 2016-02-26 ENCOUNTER — Encounter: Payer: Self-pay | Admitting: Neurology

## 2016-02-26 ENCOUNTER — Ambulatory Visit (INDEPENDENT_AMBULATORY_CARE_PROVIDER_SITE_OTHER): Payer: 59 | Admitting: Neurology

## 2016-02-26 VITALS — BP 118/70 | HR 80 | Resp 20 | Ht 65.0 in | Wt 206.0 lb

## 2016-02-26 DIAGNOSIS — F5105 Insomnia due to other mental disorder: Secondary | ICD-10-CM | POA: Diagnosis not present

## 2016-02-26 DIAGNOSIS — F845 Asperger's syndrome: Secondary | ICD-10-CM

## 2016-02-26 MED ORDER — QUETIAPINE FUMARATE 25 MG PO TABS: 25.0000 mg | ORAL_TABLET | Freq: Every day | ORAL | 1 refills | Status: DC

## 2016-02-26 NOTE — Progress Notes (Signed)
NEUROLOGY CONSULTATION NOTE  Tina Wilcox MRN: [id number] DOB: 08-10-1995  Referring provider: Rowan Blase, PA-C Primary care provider: Sharilyn Sites, MD,  Pleas Koch, DO neurologist at [facility]   Reason for consult:  Recurrent transient altered awareness, diplopia, abnormal sleep .  This patient of Dr.Jaffee's is referred for a sleep evaluation.   HISTORY OF PRESENT ILLNESS: Tina Wilcox is a 20 year old right-handed woman with history of Asperger's syndrome, RLS, prior apnea, menorrhagia and dysmenorrhea who presents for dizziness.   Tina Wilcox had been tested for RLS and mild sleep apnea in supine position. This study was obtained at age 24 , in [place].   Tina Wilcox and her mother report that Tina Wilcox had difficulties with sleep at a young age. She would have night terrors waking up searching for her parents of her right in front of her.  She was also at times insomnic- these seem to come in cycles or episodes. I called the patient, reports that she feels often as if not fully awake but no longer fully asleep. She is reporting having vivid dreams and ongoing story but she cannot truly concentrate on the details of the dream content as she feels too tired to do so.  Over this year her sleep has evolved : She seems not to be able to " get out of the dream"- I cannot focus on my dream and want to sleep. I feel frustrated by crazy and vivid, bizarre dreams that keep me from sleep. "  But she reports also , that she likes her dreams. She seems to drift off several times a day.  She reports that she naps most days, she did this while in school. But feels now it's a necessity. The naps can refresh her , most naps are lasting up to 2 hours. She has noted Bruxism and TMJ, snoring is reported .  Ms. Pollitt goes to bed about midnight and sleeps until 8.30 AM. Sleep latency is usually 30 minutes, she takes melatonin. She avoids screen light in the bedroom.  Most nights she sleeps through until  morning, no nocturia , no confusional arousals, she finds herself in the same sleep position. She sleeps lateral and supine. She does not take caffeine. Takes regular breakfast , she is not exposed to a lot of daylight.  She is not an out of doors person.    05-01-14  The patient reports good success with Vybriid and has been more alert and less anxious. This may influence her sleep pattern, too.   Her sleep study was discussed- her PSG from  04-16-14 documented an AHI 4.6 and RDI of 7.2 . She has been sleepy all her life, her MSLT showed a mean sleep latency of 11 minutes and her REM latency was 210 minutes. Her MSLT was without any SREMs.  I will order a narcolepsy HLA, not change the antidepressants.  She want to drive and needs to take a stimulant modafinil to be safe.   06-26-14 Tina Wilcox is seen here today for a follow-up visit unfortunately the Provigil preparation at 100 mg daily did provoke headaches and also an anxiety / panic attack, she felt" a sense of doom". She had those before and was on Vibryd for this symptom. She felt hypervigilant at night, over 12 hors after taking it. She stopped.  She may tolerate a 50 mg dose, but unlikely to get the desired effect . HLA was negative , too. gabapentin is still used for restless legs.  8 PM and 12.00 midnight.   eliminating her morning dose helped with daytime sleepiness. The change to night time dose helps her night time sleep.   I see no other option for stimulant use in this patient with psychiatric overlay.   Interval history for 02/26/2016, Tina Wilcox is here today for follow-up visit. She continues to have some restless leg concerns but chiefly is concerned about insomnia and how these 2 conditions seem to interplay. At this time she feels actually a little bit more sleep tear and oversleeping easily. He can nap in daytime but she should avoid. She had some success with clonidine but it is not an significant improvement in  allowing her to go to sleep in a reasonable time. Her sleep latency may still be several hours in bed. The comorbidity of restless leg syndrome and borderline diabetes makes it harder for me to find a medication, gabapentin usually is safe considered for both conditions. I suggested we could also try Seroquel but I would need her behavior health provider to follow along with this medication given that diabetes may worsen under higher doses of Seroquel, usually not at 25 mg nightly. My eScription program Center red flag, that this medication contains corn products to which Tina Wilcox is highly allergic.        MEDICATIONS: Current Outpatient Prescriptions on File Prior to Visit  Medication Sig Dispense Refill  . Biotin 10 MG CAPS Take by mouth.    . cetirizine (ZYRTEC) 10 MG tablet Take 10 mg by mouth daily.    . cloNIDine (CATAPRES) 0.1 MG tablet Take 1 tablet by mouth as needed.    . gabapentin (NEURONTIN) 600 MG tablet Take 600 mg by mouth 2 (two) times daily.     Marland Kitchen LAMICTAL 100 MG tablet Take 1.5 tablets by mouth daily.    . Melatonin 10 MG CAPS Take 1 tablet by mouth at bedtime.    . Multiple Vitamins-Minerals (MULTIVITAMIN WITH MINERALS) tablet Take 1 tablet by mouth daily.      . Norethindrone-Ethinyl Estradiol-Fe Biphas (LO LOESTRIN FE) 1 MG-10 MCG / 10 MCG tablet Take 1 tablet by mouth daily. Take continuous active pills. 4 Package 4  . Vilazodone HCl (VIIBRYD) 40 MG TABS Take 40 mg by mouth daily.     No current facility-administered medications on file prior to visit.     ALLERGIES: Allergies  Allergen Reactions  . Corn-Containing Products Nausea And Vomiting  . Doxycycline     Multiple GI, Yeast, lymph node swelling, HA  . Peanut-Containing Drug Products Nausea Only  . Latex Rash    FAMILY HISTORY: Family History  Problem Relation Age of Onset  . Hypertension Father   . Kidney Stones Mother   . Kidney Stones Sister   . Thyroid disease Maternal Grandmother   .  Depression Maternal Grandmother   . Diabetes Maternal Grandfather   . CVA Maternal Grandfather   . Cancer Maternal Grandfather     unknown primary, ? melanoma  . Diabetes Paternal Grandmother   . Hypertension Paternal Grandmother   . Diabetes Paternal Grandfather   . Hypertension Paternal Grandfather   . Cancer Paternal Grandfather     leukemia    SOCIAL HISTORY: Social History   Social History  . Marital status: Single    Spouse name: N/A  . Number of children: N/A  . Years of education: N/A   Occupational History  . Not on file.   Social History Main Topics  . Smoking  status: Never Smoker  . Smokeless tobacco: Never Used  . Alcohol use No  . Drug use: No  . Sexual activity: No   Other Topics Concern  . Not on file   Social History Narrative   Right handed, Caffeine barely, HS grad.  Looking for work, Lives at home.      REVIEW OF SYSTEMS:  14 system : negative except for weight gain and insomnia.   PHYSICAL EXAM: Vitals:   02/26/16 1029  BP: 118/70  Pulse: 80  Resp: 20   General: No acute distress Head:  Normocephalic/atraumatic Neck: supple, no paraspinal tenderness, full range of motion,  No retrognathia ,   TMJ- wears a night guard.  Bruxism.  Neck circumference is  Back: No paraspinal tenderness Heart: regular rate and rhythm Lungs: Clear to auscultation bilaterally. Vascular: No carotid bruits. Neurological Exam: Mental status: alert and oriented to person, place, and time, recent and remote memory intact, fund of knowledge intact, attention and concentration intact, speech fluent and not dysarthric, language intact. Cranial nerves:  There is esotropia  (mild ) of the left eye with conjugate gaze,  full extraocular movements in the horizontal and vertical planes without nystagmus,  pupillary response to light is preserved.   No facial asymmetry is noted, normal hearing, normal tongue and uvula midline movement.   Motor : equal tone and mass,  no cog wheeling, normal strength.  Sensory intact to primary modalities.   Heel to shin/ finger to nose : no dysmetria Gait: normal station and stride.  Able to turn and walk in tandem.  Heel walk unsteady, but toe walk is normal, Romberg negative.  IMPRESSION: Primary insomnia  related to the underlying ASPERGER, clonidine has helped  sleep. Her insomnia is much more evident after she had a couple of days without clonidine. She has been treated with gabapentin for restless legs, temazepam and trazodone both have not helped with the insomnia but created side effects.  She has not tried Seroquel, but she is allergic to corn products which this medication contains.  Klonopin is a benzodiazepine and I hesitate to use it.   She does not have parasomnias, she does not enact dreams. She is not moving during sleep.   1) episodic Insomnia and dream intrusion, in the setting of obesity and large neck size-  But no OSA.   Her Epworth score of 11, was before 13  - her FSS is 46 -, she cannot fall asleep easily.   No OSA found in PSG-.  Since her REM onset was delayed ( PSG ) and no SREMs seen in the MSLT, dx is not narcolepsy. The HLA test  was negative also- 05-01-2014, Modafinil , Nuvigil helps with daytime alertness but caused more insomnia at night.   Sleep hygiene reviewed, 35 minute visit with medication discussion.  Please remember to try to maintain good sleep hygiene, which means: Keep a regular sleep and wake schedule, try not to exercise or have a meal within 2 hours of your bedtime, try to keep your bedroom conducive for sleep, that is, cool and dark, without light distractors such as an illuminated alarm clock, and refrain from watching TV right before sleep or in the middle of the night and do not keep the TV or radio on during the night. Also, try not to use or play on electronic devices at bedtime, such as your cell phone, tablet PC or laptop. If you like to read at bedtime on an electronic  device, try  to dim the background light as much as possible. Do not eat in the middle of the night.   For chronic insomnia, you are best followed by a psychiatrist and/or sleep psychologist.   RV prn.   CC: primary neurologist  Is Pleas Koch, MD   Eino Farber, [place] -psychiatry;   Rowan Blase, PA,  Sharilyn Sites, MD

## 2016-02-28 ENCOUNTER — Telehealth: Payer: Self-pay | Admitting: Neurology

## 2016-02-28 NOTE — Telephone Encounter (Signed)
I spoke to pt's mother. It appears that no medications are currently being prescribed by Dr. Brett Fairy. I advised her that it appears that Dr. Brett Fairy prescribed quetiapine on 02/26/2016 at 11:01am but then discontinued it on 02/26/2016 at 11:01am. However, the RX was still sent electronically to the pharmacy. I advised pt's mother that the pt should not be taking quetiapine at this time. Pt's mother verbalized understanding.

## 2016-02-28 NOTE — Telephone Encounter (Signed)
I indeed had suggested Seroquel as sleep inducer but in review of interference with other medications learnt that this was not a good choice for the patient and she has a listed allergy to one of the components.  The prescription was already sent and the medication was d/c within minutes. We agreed to leave the patient without any prescription and work on sleep hygiene. CD

## 2016-02-28 NOTE — Telephone Encounter (Signed)
Pt's mother, Lelan Pons, called to ask if her daughter is supposed to start any new mediation. She had an appt and they were under the impression no new medication was to be started. However, there is a rx for the pt at pharmacy.  Please call and advise [phone]

## 2016-05-05 DIAGNOSIS — M1990 Unspecified osteoarthritis, unspecified site: Secondary | ICD-10-CM

## 2016-05-05 DIAGNOSIS — E785 Hyperlipidemia, unspecified: Secondary | ICD-10-CM

## 2016-05-05 HISTORY — DX: Unspecified osteoarthritis, unspecified site: M19.90

## 2016-05-05 HISTORY — DX: Hyperlipidemia, unspecified: E78.5

## 2016-05-22 DIAGNOSIS — M530 Cervicocranial syndrome: Secondary | ICD-10-CM | POA: Diagnosis not present

## 2016-05-22 DIAGNOSIS — M9901 Segmental and somatic dysfunction of cervical region: Secondary | ICD-10-CM | POA: Diagnosis not present

## 2016-05-26 DIAGNOSIS — G479 Sleep disorder, unspecified: Secondary | ICD-10-CM | POA: Diagnosis not present

## 2016-05-29 DIAGNOSIS — G479 Sleep disorder, unspecified: Secondary | ICD-10-CM | POA: Diagnosis not present

## 2016-06-05 DIAGNOSIS — G479 Sleep disorder, unspecified: Secondary | ICD-10-CM | POA: Diagnosis not present

## 2016-06-12 DIAGNOSIS — M9901 Segmental and somatic dysfunction of cervical region: Secondary | ICD-10-CM | POA: Diagnosis not present

## 2016-06-12 DIAGNOSIS — M530 Cervicocranial syndrome: Secondary | ICD-10-CM | POA: Diagnosis not present

## 2016-06-16 DIAGNOSIS — G479 Sleep disorder, unspecified: Secondary | ICD-10-CM | POA: Diagnosis not present

## 2016-07-03 DIAGNOSIS — M530 Cervicocranial syndrome: Secondary | ICD-10-CM | POA: Diagnosis not present

## 2016-07-03 DIAGNOSIS — M9901 Segmental and somatic dysfunction of cervical region: Secondary | ICD-10-CM | POA: Diagnosis not present

## 2016-07-10 DIAGNOSIS — Z0289 Encounter for other administrative examinations: Secondary | ICD-10-CM

## 2016-07-24 DIAGNOSIS — M530 Cervicocranial syndrome: Secondary | ICD-10-CM | POA: Diagnosis not present

## 2016-07-24 DIAGNOSIS — M9901 Segmental and somatic dysfunction of cervical region: Secondary | ICD-10-CM | POA: Diagnosis not present

## 2016-08-18 DIAGNOSIS — M530 Cervicocranial syndrome: Secondary | ICD-10-CM | POA: Diagnosis not present

## 2016-08-18 DIAGNOSIS — M9901 Segmental and somatic dysfunction of cervical region: Secondary | ICD-10-CM | POA: Diagnosis not present

## 2016-09-04 ENCOUNTER — Ambulatory Visit (INDEPENDENT_AMBULATORY_CARE_PROVIDER_SITE_OTHER): Payer: 59 | Admitting: Sports Medicine

## 2016-09-04 ENCOUNTER — Encounter: Payer: Self-pay | Admitting: Sports Medicine

## 2016-09-04 DIAGNOSIS — M79672 Pain in left foot: Secondary | ICD-10-CM | POA: Diagnosis not present

## 2016-09-04 MED ORDER — CALCIUM CARBONATE-VITAMIN D 600-400 MG-UNIT PO TABS: 1.0000 | ORAL_TABLET | Freq: Two times a day (BID) | ORAL | 11 refills | Status: DC

## 2016-09-04 NOTE — Progress Notes (Signed)
   Subjective:    I'm seeing this patient as a consultation for:  Dr. Sharilyn Sites  CC: Left foot pain  HPI: For several months this pleasant 21 year old female who has been doing a fairly extensive exercise program on her treadmill has had pain that she localizes over the dorsum of her foot, particularly over the fourth metatarsal shaft. Interestingly it has started to get better. She has slightly decreased her mileage. She also tells me that her weight may have increased over the past couple of months to some degree.  Past medical history:  Negative.  See flowsheet/record as well for more information.  Surgical history: Negative.  See flowsheet/record as well for more information.  Family history: Negative.  See flowsheet/record as well for more information.  Social history: Negative.  See flowsheet/record as well for more information.  Allergies, and medications have been entered into the medical record, reviewed, and no changes needed.   Review of Systems: No headache, visual changes, nausea, vomiting, diarrhea, constipation, dizziness, abdominal pain, skin rash, fevers, chills, night sweats, weight loss, swollen lymph nodes, body aches, joint swelling, muscle aches, chest pain, shortness of breath, mood changes, visual or auditory hallucinations.   Objective:   General: Well Developed, well nourished, and in no acute distress.  Neuro/Psych: Alert and oriented x3, extra-ocular muscles intact, able to move all 4 extremities, sensation grossly intact. Skin: Warm and dry, no rashes noted.  Respiratory: Not using accessory muscles, speaking in full sentences, trachea midline.  Cardiovascular: Pulses palpable, no extremity edema. Abdomen: Does not appear distended. Left Foot: No visible erythema or swelling. Range of motion is full in all directions. Strength is 5/5 in all directions. No hallux valgus. No pes cavus or pes planus. No abnormal callus noted. No pain over the navicular  prominence, or base of fifth metatarsal. No tenderness to palpation of the calcaneal insertion of plantar fascia. No pain at the Achilles insertion. No pain over the calcaneal bursa. No pain of the retrocalcaneal bursa. Mild tenderness to palpation over the dorsal mid shaft of the fourth metatarsal No hallux rigidus or limitus. No tenderness palpation over interphalangeal joints. No pain with compression of the metatarsal heads. Neurovascularly intact distally. Left Ankle: No visible erythema or swelling. Range of motion is full in all directions. Strength is 5/5 in all directions. Stable lateral and medial ligaments; squeeze test and kleiger test unremarkable; Talar dome nontender; No pain at base of 5th MT; No tenderness over cuboid; No tenderness over N spot or navicular prominence No tenderness on posterior aspects of lateral and medial malleolus No sign of peroneal tendon subluxations; Negative tarsal tunnel tinel's Able to walk 4 steps.  Impression and Recommendations:   This case required medical decision making of moderate complexity.  Left foot pain Suspect fourth metatarsal stress fracture. Postop shoe, calcium and vitamin D supplement, she will avoid the treadmill for the next month, okay to use the stationary bike.

## 2016-09-04 NOTE — Assessment & Plan Note (Signed)
Suspect fourth metatarsal stress fracture. Postop shoe, calcium and vitamin D supplement, she will avoid the treadmill for the next month, okay to use the stationary bike.

## 2016-09-05 ENCOUNTER — Ambulatory Visit (INDEPENDENT_AMBULATORY_CARE_PROVIDER_SITE_OTHER): Payer: 59

## 2016-09-05 DIAGNOSIS — M79672 Pain in left foot: Secondary | ICD-10-CM

## 2016-09-08 DIAGNOSIS — M530 Cervicocranial syndrome: Secondary | ICD-10-CM | POA: Diagnosis not present

## 2016-09-08 DIAGNOSIS — M9901 Segmental and somatic dysfunction of cervical region: Secondary | ICD-10-CM | POA: Diagnosis not present

## 2016-10-02 ENCOUNTER — Ambulatory Visit: Payer: 59 | Admitting: Sports Medicine

## 2016-10-07 DIAGNOSIS — M9901 Segmental and somatic dysfunction of cervical region: Secondary | ICD-10-CM | POA: Diagnosis not present

## 2016-10-07 DIAGNOSIS — M530 Cervicocranial syndrome: Secondary | ICD-10-CM | POA: Diagnosis not present

## 2016-10-29 DIAGNOSIS — M9901 Segmental and somatic dysfunction of cervical region: Secondary | ICD-10-CM | POA: Diagnosis not present

## 2016-10-29 DIAGNOSIS — M530 Cervicocranial syndrome: Secondary | ICD-10-CM | POA: Diagnosis not present

## 2016-11-12 DIAGNOSIS — M530 Cervicocranial syndrome: Secondary | ICD-10-CM | POA: Diagnosis not present

## 2016-11-12 DIAGNOSIS — M9901 Segmental and somatic dysfunction of cervical region: Secondary | ICD-10-CM | POA: Diagnosis not present

## 2016-12-03 DIAGNOSIS — M9901 Segmental and somatic dysfunction of cervical region: Secondary | ICD-10-CM | POA: Diagnosis not present

## 2016-12-03 DIAGNOSIS — M530 Cervicocranial syndrome: Secondary | ICD-10-CM | POA: Diagnosis not present

## 2016-12-08 DIAGNOSIS — D2312 Other benign neoplasm of skin of left eyelid, including canthus: Secondary | ICD-10-CM | POA: Diagnosis not present

## 2016-12-24 DIAGNOSIS — M530 Cervicocranial syndrome: Secondary | ICD-10-CM | POA: Diagnosis not present

## 2016-12-24 DIAGNOSIS — M9901 Segmental and somatic dysfunction of cervical region: Secondary | ICD-10-CM | POA: Diagnosis not present

## 2017-01-01 DIAGNOSIS — G479 Sleep disorder, unspecified: Secondary | ICD-10-CM | POA: Diagnosis not present

## 2017-01-13 DIAGNOSIS — M530 Cervicocranial syndrome: Secondary | ICD-10-CM | POA: Diagnosis not present

## 2017-01-13 DIAGNOSIS — M9901 Segmental and somatic dysfunction of cervical region: Secondary | ICD-10-CM | POA: Diagnosis not present

## 2017-01-20 DIAGNOSIS — M25572 Pain in left ankle and joints of left foot: Secondary | ICD-10-CM | POA: Diagnosis not present

## 2017-01-20 DIAGNOSIS — G8929 Other chronic pain: Secondary | ICD-10-CM | POA: Diagnosis not present

## 2017-01-29 DIAGNOSIS — M19072 Primary osteoarthritis, left ankle and foot: Secondary | ICD-10-CM | POA: Diagnosis not present

## 2017-02-03 DIAGNOSIS — M9901 Segmental and somatic dysfunction of cervical region: Secondary | ICD-10-CM | POA: Diagnosis not present

## 2017-02-03 DIAGNOSIS — M530 Cervicocranial syndrome: Secondary | ICD-10-CM | POA: Diagnosis not present

## 2017-02-04 NOTE — Progress Notes (Signed)
21 y.o. G0P0000 Single Caucasian female here for annual exam.    Taking LoLoestrin continuously.   Occasional brown spotting monthly and moodiness. Also having cramping.  Withdraws every 3 to 4 months.   The mood swings are what is the most bothersome to her.  She has anxiety and depression and difficulty sleeping.   ROS- headache, unscheduled bleeding, muscle/joint pain.   Has post traumatic arthritis left leg.  PCP: Sharilyn Sites, MD  Patient's last menstrual period was 02/03/2017 (approximate).     Period Cycle (Days):  (every 90 days--but now with occ.breakthrough bleeding) Period Duration (Days): 3 days Period Pattern: (!) Irregular Menstrual Control: Maxi pad Dysmenorrhea: (!) Moderate Dysmenorrhea Symptoms: Cramping, Diarrhea, Headache     Sexually active: No.  Never. The current method of family planning is Abstinence/OCP (estrogen/progesterone)--LoLoestrin.    Exercising: Yes.    Treadmill Smoker:  no  Health Maintenance: Pap: 01-26-15 Neg History of abnormal Pap:  no MMG:  n/a Colonoscopy:  n/a BMD:   n/a  Result  n/a TDaP:  UNSURE  Gardasil:   no KWI:OXBDZH Hep C:Unsure Screening Labs:  Urine today: not done   reports that she has never smoked. She has never used smokeless tobacco. She reports that she does not drink alcohol or use drugs.  Past Medical History:  Diagnosis Date  . 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 Asperger's syndrome   . Dermatitis   . Dysmenorrhea   . Headache   . Menorrhagia   . Restless legs syndrome (RLS)     Past Surgical History:  Procedure Laterality Date  . TONSILECTOMY/ADENOIDECTOMY WITH MYRINGOTOMY    . WISDOM TOOTH EXTRACTION  09/02/12    Current Outpatient Prescriptions  Medication Sig Dispense Refill  . Biotin 10 MG CAPS Take by mouth.    . cetirizine (ZYRTEC) 10 MG tablet Take 10 mg by mouth daily.    . cloNIDine (CATAPRES) 0.1 MG tablet Take 1 tablet by mouth as needed.    . gabapentin (NEURONTIN) 600  MG tablet Take 600 mg by mouth 2 (two) times daily.     Marland Kitchen LAMICTAL 100 MG tablet Take 1.5 tablets by mouth daily.    . Melatonin 10 MG CAPS Take 1 tablet by mouth at bedtime.    . Norethindrone-Ethinyl Estradiol-Fe Biphas (LO LOESTRIN FE) 1 MG-10 MCG / 10 MCG tablet Take 1 tablet by mouth daily. Take continuous active pills. 4 Package 4  . Vilazodone HCl (VIIBRYD) 40 MG TABS Take 40 mg by mouth daily.     No current facility-administered medications for this visit.     Family History  Problem Relation Age of Onset  . Hypertension Father   . Kidney Stones Mother   . Kidney Stones Sister   . Thyroid disease Maternal Grandmother   . Depression Maternal Grandmother   . Diabetes Maternal Grandfather   . CVA Maternal Grandfather   . Cancer Maternal Grandfather        unknown primary, ? melanoma  . Diabetes Paternal Grandmother   . Hypertension Paternal Grandmother   . Diabetes Paternal Grandfather   . Hypertension Paternal Grandfather   . Cancer Paternal Grandfather        leukemia    ROS:  Pertinent items are noted in HPI.  Otherwise, a comprehensive ROS was negative.  Exam:   BP 110/66 (BP Location: Right Arm, Patient Position: Sitting, Cuff Size: Large)   Pulse 70   Resp 20   Ht 5\' 5"  (1.651 m)  Wt 221 lb 9.6 oz (100.5 kg)   LMP 02/03/2017 (Approximate)   BMI 36.88 kg/m     General appearance: alert, cooperative and appears stated age Head: Normocephalic, without obvious abnormality, atraumatic Neck: no adenopathy, supple, symmetrical, trachea midline and thyroid normal to inspection and palpation Lungs: clear to auscultation bilaterally Breasts: normal appearance, no masses or tenderness, No nipple retraction or dimpling, No nipple discharge or bleeding, No axillary or supraclavicular adenopathy Heart: regular rate and rhythm Abdomen: soft, non-tender; no masses, no organomegaly Extremities: extremities normal, atraumatic, no cyanosis or edema Skin: Skin color, texture,  turgor normal. No rashes or lesions Lymph nodes: Cervical, supraclavicular, and axillary nodes normal. No abnormal inguinal nodes palpated Neurologic: Grossly normal  Pelvic: External genitalia:  no lesions              Urethra:  normal appearing urethra with no masses, tenderness or lesions              Bartholins and Skenes: normal                 Vagina: normal appearing vagina with normal color and discharge, no lesions              Cervix: no lesions              Pap taken: No. Bimanual Exam:  Uterus:  normal size, contour, position, consistency, mobility, non-tender              Adnexa: no mass, fullness, tenderness                Chaperone was present for exam.  Assessment:   Well woman visit with normal exam. PMS symptoms. Hx anxiety and depression.  Plan: Switch to Yaz.  Will do continuous for 3 months at a time.  Discussed risks of stroke, MI, DVT, PE. She will report back how she is doing on this new OCP. Recommended self breast awareness. Pap and HR HPV as above. Guidelines for Calcium, Vitamin D, regular exercise program including cardiovascular and weight bearing exercise. Routine labs.  Gardasil 9. Follow up annually and prn.   After visit summary provided.

## 2017-02-05 ENCOUNTER — Encounter: Payer: Self-pay | Admitting: Obstetrics and Gynecology

## 2017-02-05 ENCOUNTER — Ambulatory Visit (INDEPENDENT_AMBULATORY_CARE_PROVIDER_SITE_OTHER): Payer: 59 | Admitting: Obstetrics and Gynecology

## 2017-02-05 VITALS — BP 110/66 | HR 70 | Resp 20 | Ht 65.0 in | Wt 221.6 lb

## 2017-02-05 DIAGNOSIS — Z01419 Encounter for gynecological examination (general) (routine) without abnormal findings: Secondary | ICD-10-CM | POA: Diagnosis not present

## 2017-02-05 DIAGNOSIS — Z23 Encounter for immunization: Secondary | ICD-10-CM | POA: Diagnosis not present

## 2017-02-05 MED ORDER — DROSPIRENONE-ETHINYL ESTRADIOL 3-0.02 MG PO TABS: 1.0000 | ORAL_TABLET | Freq: Every day | ORAL | 3 refills | Status: DC

## 2017-02-05 NOTE — Patient Instructions (Signed)

## 2017-02-06 ENCOUNTER — Encounter: Payer: Self-pay | Admitting: Obstetrics and Gynecology

## 2017-02-06 LAB — COMPREHENSIVE METABOLIC PANEL
ALBUMIN: 4.5 g/dL (ref 3.5–5.5)
ALK PHOS: 69 IU/L (ref 39–117)
ALT: 10 IU/L (ref 0–32)
AST: 15 IU/L (ref 0–40)
Albumin/Globulin Ratio: 1.8 (ref 1.2–2.2)
BUN/Creatinine Ratio: 8 — ABNORMAL LOW (ref 9–23)
BUN: 6 mg/dL (ref 6–20)
CHLORIDE: 104 mmol/L (ref 96–106)
CO2: 22 mmol/L (ref 20–29)
CREATININE: 0.73 mg/dL (ref 0.57–1.00)
Calcium: 9.9 mg/dL (ref 8.7–10.2)
GFR calc Af Amer: 134 mL/min/{1.73_m2} (ref >59)
GFR calc non Af Amer: 116 mL/min/{1.73_m2} (ref >59)
Globulin, Total: 2.5 g/dL (ref 1.5–4.5)
Glucose: 81 mg/dL (ref 65–99)
Potassium: 4.4 mmol/L (ref 3.5–5.2)
Sodium: 143 mmol/L (ref 134–144)
Total Protein: 7 g/dL (ref 6.0–8.5)

## 2017-02-06 LAB — CBC
HEMOGLOBIN: 13.2 g/dL (ref 11.1–15.9)
Hematocrit: 39.7 % (ref 34.0–46.6)
MCH: 28.3 pg (ref 26.6–33.0)
MCHC: 33.2 g/dL (ref 31.5–35.7)
MCV: 85 fL (ref 79–97)
PLATELETS: 384 10*3/uL — AB (ref 150–379)
RBC: 4.67 x10E6/uL (ref 3.77–5.28)
RDW: 13.3 % (ref 12.3–15.4)
WBC: 6.3 10*3/uL (ref 3.4–10.8)

## 2017-02-06 LAB — HEMOGLOBIN A1C
ESTIMATED AVERAGE GLUCOSE: 111 mg/dL
HEMOGLOBIN A1C: 5.5 % (ref 4.8–5.6)

## 2017-02-06 LAB — LIPID PANEL
CHOL/HDL RATIO: 5 ratio — AB (ref 0.0–4.4)
Cholesterol, Total: 213 mg/dL — ABNORMAL HIGH (ref 100–199)
HDL: 43 mg/dL (ref >39)
LDL CALC: 117 mg/dL — AB (ref 0–99)
Triglycerides: 264 mg/dL — ABNORMAL HIGH (ref 0–149)
VLDL Cholesterol Cal: 53 mg/dL — ABNORMAL HIGH (ref 5–40)

## 2017-02-06 LAB — TSH: TSH: 2.43 u[IU]/mL (ref 0.450–4.500)

## 2017-02-09 ENCOUNTER — Telehealth: Payer: Self-pay

## 2017-02-09 NOTE — Telephone Encounter (Signed)
-----   Message from Nunzio Cobbs, MD sent at 02/06/2017  9:54 AM EDT ----- Please contact patient with results. Her cholesterol and triglycerides are elevated, and I am recommending she see her PCP.  She needs to start a diet low in saturated fats and low in cholesterol and start exercising in order to reduce these number and to reduce the risk of future cardiovascular disease.  Her hemoglobin A1C, blood chemistries and thyroid function are all normal.  Her platelets were minimally elevated, and this is not of concern.

## 2017-02-09 NOTE — Telephone Encounter (Signed)
Called patient to discuss lab results, LMOVM to call back.

## 2017-02-10 DIAGNOSIS — M7672 Peroneal tendinitis, left leg: Secondary | ICD-10-CM | POA: Diagnosis not present

## 2017-02-12 NOTE — Telephone Encounter (Signed)
Left message to call Shambhavi Salley at [phone].  

## 2017-02-17 DIAGNOSIS — M7672 Peroneal tendinitis, left leg: Secondary | ICD-10-CM | POA: Diagnosis not present

## 2017-02-18 DIAGNOSIS — F810 Specific reading disorder: Secondary | ICD-10-CM | POA: Diagnosis not present

## 2017-02-18 NOTE — Telephone Encounter (Signed)
Left message to call Sharee Pimple, RN to discuss recent labs at [phone].

## 2017-02-20 NOTE — Telephone Encounter (Signed)
Spoke with patient, advised as seen below per Dr. Quincy Simmonds. Patient request copy of labs dated 02/05/17 to be faxed to PCP Dr. Hilma Favors. Patient verbalizes understanding and is agreeable.   Labs dated 02/05/17 faxed to Dr.  Hilma Favors at [phone].  Patient is agreeable to disposition. Will close encounter.

## 2017-02-23 DIAGNOSIS — M7672 Peroneal tendinitis, left leg: Secondary | ICD-10-CM | POA: Diagnosis not present

## 2017-02-25 DIAGNOSIS — M530 Cervicocranial syndrome: Secondary | ICD-10-CM | POA: Diagnosis not present

## 2017-02-25 DIAGNOSIS — M9901 Segmental and somatic dysfunction of cervical region: Secondary | ICD-10-CM | POA: Diagnosis not present

## 2017-03-02 DIAGNOSIS — M7672 Peroneal tendinitis, left leg: Secondary | ICD-10-CM | POA: Diagnosis not present

## 2017-03-05 DIAGNOSIS — M530 Cervicocranial syndrome: Secondary | ICD-10-CM | POA: Diagnosis not present

## 2017-03-05 DIAGNOSIS — M9901 Segmental and somatic dysfunction of cervical region: Secondary | ICD-10-CM | POA: Diagnosis not present

## 2017-03-17 DIAGNOSIS — M7672 Peroneal tendinitis, left leg: Secondary | ICD-10-CM | POA: Diagnosis not present

## 2017-03-18 ENCOUNTER — Telehealth: Payer: Self-pay | Admitting: Obstetrics and Gynecology

## 2017-03-18 MED ORDER — NORETHINDRONE ACET-ETHINYL EST 1-20 MG-MCG PO TABS: 1.0000 | ORAL_TABLET | Freq: Every day | ORAL | 0 refills | Status: DC

## 2017-03-18 NOTE — Telephone Encounter (Signed)
Spoke with patient's mother Lelan Pons, okay per ROI. Mother states that the patient was switched from [place] to Yaz due to increased BTB. Patient takes continuous active pills. States since starting Yaz the patient has been very emotional and keep having "meltdowns." Reports this was not occurring while on Lo Loestrin. No other med changes. Patient is requesting an alternative option due to mood fluctuations. Advised will review with Dr.Silva and return call.

## 2017-03-18 NOTE — Telephone Encounter (Signed)
Spoke with patient's mother Lelan Pons, okay per ROI. Advised of message as seen below from [place]. Mother verbalizes understanding. 3 month recheck scheduled for 06/10/2017 at 1 pm with Dr.Silva. Rx for Loestrin 1/20 #4 0RF as patient takes continuos active pills sent to pharmacy on file. Mother is agreeable. Encounter closed.

## 2017-03-18 NOTE — Telephone Encounter (Signed)
Patient may want to consider going on Loestrin 1/20 instead of continuing with Yaz. This will bring her back to the same chemical formulation she was on previously but just increase the dosage slightly. [place] for 3 months and then needs a recheck.

## 2017-03-18 NOTE — Telephone Encounter (Signed)
Patient mother called wants her daughter's birth control changed.

## 2017-03-23 DIAGNOSIS — M9901 Segmental and somatic dysfunction of cervical region: Secondary | ICD-10-CM | POA: Diagnosis not present

## 2017-03-23 DIAGNOSIS — M530 Cervicocranial syndrome: Secondary | ICD-10-CM | POA: Diagnosis not present

## 2017-03-25 DIAGNOSIS — M7672 Peroneal tendinitis, left leg: Secondary | ICD-10-CM | POA: Diagnosis not present

## 2017-04-01 DIAGNOSIS — M7672 Peroneal tendinitis, left leg: Secondary | ICD-10-CM | POA: Diagnosis not present

## 2017-04-06 DIAGNOSIS — M7672 Peroneal tendinitis, left leg: Secondary | ICD-10-CM | POA: Diagnosis not present

## 2017-04-07 DIAGNOSIS — F810 Specific reading disorder: Secondary | ICD-10-CM | POA: Diagnosis not present

## 2017-04-09 ENCOUNTER — Other Ambulatory Visit: Payer: Self-pay

## 2017-04-09 ENCOUNTER — Ambulatory Visit (INDEPENDENT_AMBULATORY_CARE_PROVIDER_SITE_OTHER): Payer: 59

## 2017-04-09 VITALS — BP 116/72 | HR 72 | Resp 16 | Wt 221.0 lb

## 2017-04-09 DIAGNOSIS — Z23 Encounter for immunization: Secondary | ICD-10-CM

## 2017-04-09 NOTE — Progress Notes (Signed)
Patient in office for 2nd Gardasil vaccine. Patient states that she did not have any reactions to the previous injection. Injection administered in the Left Deltoid and was tolerated well. Routing to provider for final review. Patient agreeable to disposition. Will close encounter.

## 2017-04-10 DIAGNOSIS — M7672 Peroneal tendinitis, left leg: Secondary | ICD-10-CM | POA: Diagnosis not present

## 2017-04-15 DIAGNOSIS — M530 Cervicocranial syndrome: Secondary | ICD-10-CM | POA: Diagnosis not present

## 2017-04-15 DIAGNOSIS — M9901 Segmental and somatic dysfunction of cervical region: Secondary | ICD-10-CM | POA: Diagnosis not present

## 2017-04-16 DIAGNOSIS — G479 Sleep disorder, unspecified: Secondary | ICD-10-CM | POA: Diagnosis not present

## 2017-05-06 DIAGNOSIS — M530 Cervicocranial syndrome: Secondary | ICD-10-CM | POA: Diagnosis not present

## 2017-05-06 DIAGNOSIS — M9901 Segmental and somatic dysfunction of cervical region: Secondary | ICD-10-CM | POA: Diagnosis not present

## 2017-05-11 DIAGNOSIS — G479 Sleep disorder, unspecified: Secondary | ICD-10-CM | POA: Diagnosis not present

## 2017-05-21 DIAGNOSIS — G479 Sleep disorder, unspecified: Secondary | ICD-10-CM | POA: Diagnosis not present

## 2017-05-27 DIAGNOSIS — M530 Cervicocranial syndrome: Secondary | ICD-10-CM | POA: Diagnosis not present

## 2017-05-27 DIAGNOSIS — M9901 Segmental and somatic dysfunction of cervical region: Secondary | ICD-10-CM | POA: Diagnosis not present

## 2017-06-10 ENCOUNTER — Other Ambulatory Visit: Payer: Self-pay

## 2017-06-10 ENCOUNTER — Ambulatory Visit (INDEPENDENT_AMBULATORY_CARE_PROVIDER_SITE_OTHER): Payer: 59 | Admitting: Obstetrics and Gynecology

## 2017-06-10 ENCOUNTER — Encounter: Payer: Self-pay | Admitting: Obstetrics and Gynecology

## 2017-06-10 VITALS — BP 120/76 | HR 72 | Resp 16 | Wt 221.0 lb

## 2017-06-10 DIAGNOSIS — Z3041 Encounter for surveillance of contraceptive pills: Secondary | ICD-10-CM | POA: Diagnosis not present

## 2017-06-10 DIAGNOSIS — N760 Acute vaginitis: Secondary | ICD-10-CM | POA: Diagnosis not present

## 2017-06-10 DIAGNOSIS — N926 Irregular menstruation, unspecified: Secondary | ICD-10-CM

## 2017-06-10 MED ORDER — NORETHINDRONE ACET-ETHINYL EST 1.5-30 MG-MCG PO TABS: 1.0000 | ORAL_TABLET | Freq: Every day | ORAL | 2 refills | Status: DC

## 2017-06-10 MED ORDER — NYSTATIN 100000 UNIT/GM EX CREA: 1.0000 "application " | TOPICAL_CREAM | Freq: Two times a day (BID) | CUTANEOUS | 0 refills | Status: DC

## 2017-06-10 NOTE — Progress Notes (Signed)
GYNECOLOGY  VISIT   HPI: 22 y.o.   Single  Caucasian  female   G0P0000 with Patient's last menstrual period was 06/08/2017.   here for   3 month OCP recheck/vaginal irritation and increased discharge per patient. Vulvar itching.   Was on LoLoEstrin in 2017.  Had break through bleeding.   Started Yaz but had emotional changes so this was stopped.  Has underlying depression.   Switched to Loestrin 1/20.   Had spotting off an on from Nov 27 - Jan 6.  No missed pills.  Jan 31 had red blood and nothing until 06/08/17, which has now almost ended.  Taking the pills continuously and wants to withdraw every 3 months.   Noticed some yellow discharge after her long cycle.  Not sexually active.  Takes the pills for cycle control and emotional stability.   GYNECOLOGIC HISTORY: Patient's last menstrual period was 06/08/2017. Contraception:  OCP Menopausal hormone therapy:  n/a Last mammogram:  n/a Last pap smear:   01/26/15 Negative        OB History    Gravida Para Term Preterm AB Living   0 0 0 0 0 0   SAB TAB Ectopic Multiple Live Births   0 0 0 0 0         Patient Active Problem List   Diagnosis Date Noted  . Left foot pain 09/04/2016  . Fatigue due to depression 06/26/2014  . Insomnia secondary to anxiety 05/01/2014  . Hypersomnia, periodic 05/01/2014  . Recurrent hypersomnia 04/25/2014  . Asperger's syndrome 03/01/2014  . Insomnia due to medical condition 03/01/2014  . Obesity 03/01/2014  . Parasomnia overlap disorder 03/01/2014  . Asperger syndrome 12/20/2012  . Other abnormal glucose 02/10/2011    Past Medical History:  Diagnosis Date  . 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 Asperger's syndrome   . Dermatitis   . Dysmenorrhea   . Headache   . Hyperlipidemia 2018  . Menorrhagia   . Restless legs syndrome (RLS)     Past Surgical History:  Procedure Laterality Date  . TONSILECTOMY/ADENOIDECTOMY WITH MYRINGOTOMY    . WISDOM TOOTH EXTRACTION  09/02/12     Current Outpatient Medications  Medication Sig Dispense Refill  . Biotin 10 MG CAPS Take by mouth.    . cetirizine (ZYRTEC) 10 MG tablet Take 10 mg by mouth daily.    . cloNIDine (CATAPRES) 0.1 MG tablet Take 1 tablet by mouth as needed.    . gabapentin (NEURONTIN) 600 MG tablet Take 600 mg by mouth 2 (two) times daily.     Marland Kitchen LAMICTAL 100 MG tablet Take 1.5 tablets by mouth daily.    . Melatonin 10 MG CAPS Take 1 tablet by mouth at bedtime.    . norethindrone-ethinyl estradiol (LOESTRIN 1/20, 21,) 1-20 MG-MCG tablet Take 1 tablet daily by mouth. Takes continuous active pills. Needs 4 packs for 3 months. 4 Package 0  . Vilazodone HCl (VIIBRYD) 40 MG TABS Take 40 mg by mouth daily.     No current facility-administered medications for this visit.      ALLERGIES: Corn-containing products; Doxycycline; Peanut-containing drug products; and Latex  Family History  Problem Relation Age of Onset  . Hypertension Father   . Kidney Stones Mother   . Kidney Stones Sister   . Thyroid disease Maternal Grandmother   . Depression Maternal Grandmother   . Diabetes Maternal Grandfather   . CVA Maternal Grandfather   . Cancer Maternal Grandfather  unknown primary, ? melanoma  . Diabetes Paternal Grandmother   . Hypertension Paternal Grandmother   . Diabetes Paternal Grandfather   . Hypertension Paternal Grandfather   . Cancer Paternal Grandfather        leukemia    Social History   Socioeconomic History  . Marital status: Single    Spouse name: Not on file  . Number of children: Not on file  . Years of education: Not on file  . Highest education level: Not on file  Social Needs  . Financial resource strain: Not on file  . Food insecurity - worry: Not on file  . Food insecurity - inability: Not on file  . Transportation needs - medical: Not on file  . Transportation needs - non-medical: Not on file  Occupational History  . Not on file  Tobacco Use  . Smoking status: Never  Smoker  . Smokeless tobacco: Never Used  Substance and Sexual Activity  . Alcohol use: No  . Drug use: No  . Sexual activity: No    Birth control/protection: Pill    Comment: LoLoestrin  Other Topics Concern  . Not on file  Social History Narrative   Right handed, Caffeine barely, HS grad.  Looking for work, Lives at home.      ROS:  Pertinent items are noted in HPI.  PHYSICAL EXAMINATION:    BP 120/76 (BP Location: Right Arm, Patient Position: Sitting, Cuff Size: Normal)   Pulse 72   Resp 16   Wt 221 lb (100.2 kg)   LMP 06/08/2017   BMI 36.78 kg/m     General appearance: alert, cooperative and appears stated age   Pelvic: External genitalia:  Vulvar erythema.                Urethra:  normal appearing urethra with no masses, tenderness or lesions              Bartholins and Skenes: normal                 Vagina: normal appearing vagina with normal color and discharge, no lesions              Cervix: no lesions                Bimanual Exam:  Uterus:  normal size, contour, position, consistency, mobility, non-tender              Adnexa: no mass, fullness, tenderness               Chaperone was present for exam.  ASSESSMENT  Vulvovaginitis. I suspect yeast.  Breakthrough bleeding on Loestrin 1/20.   PLAN  Affirm.  Will tx with Nystatin cream bid for 1 week.  Declines topical steroid treatment.  Switch to LoEstrin 1.5/30.  3 packs and 2 refills.  Will take continuously for 9 weeks and then withdraw.  Follow up in 3 months.    An After Visit Summary was printed and given to the patient.  __15___ minutes face to face time of which over 50% was spent in counseling.

## 2017-06-11 LAB — VAGINITIS/VAGINOSIS, DNA PROBE
CANDIDA SPECIES: NEGATIVE
GARDNERELLA VAGINALIS: NEGATIVE
TRICHOMONAS VAG: NEGATIVE

## 2017-06-17 DIAGNOSIS — M9901 Segmental and somatic dysfunction of cervical region: Secondary | ICD-10-CM | POA: Diagnosis not present

## 2017-06-17 DIAGNOSIS — M530 Cervicocranial syndrome: Secondary | ICD-10-CM | POA: Diagnosis not present

## 2017-07-08 DIAGNOSIS — M530 Cervicocranial syndrome: Secondary | ICD-10-CM | POA: Diagnosis not present

## 2017-07-08 DIAGNOSIS — M9901 Segmental and somatic dysfunction of cervical region: Secondary | ICD-10-CM | POA: Diagnosis not present

## 2017-07-21 DIAGNOSIS — G479 Sleep disorder, unspecified: Secondary | ICD-10-CM | POA: Diagnosis not present

## 2017-07-29 DIAGNOSIS — M9901 Segmental and somatic dysfunction of cervical region: Secondary | ICD-10-CM | POA: Diagnosis not present

## 2017-07-29 DIAGNOSIS — M530 Cervicocranial syndrome: Secondary | ICD-10-CM | POA: Diagnosis not present

## 2017-08-10 ENCOUNTER — Ambulatory Visit (INDEPENDENT_AMBULATORY_CARE_PROVIDER_SITE_OTHER): Payer: 59 | Admitting: *Deleted

## 2017-08-10 ENCOUNTER — Other Ambulatory Visit: Payer: Self-pay

## 2017-08-10 VITALS — BP 122/84 | HR 68 | Resp 14 | Ht 64.5 in | Wt 221.5 lb

## 2017-08-10 DIAGNOSIS — Z23 Encounter for immunization: Secondary | ICD-10-CM | POA: Diagnosis not present

## 2017-08-10 NOTE — Progress Notes (Signed)
Patient here for third gardasil shot. Patient states no previous reactions to gardasil. Patient currently on OCP's. Consent obtained. Injection administered in right deltoid and patient tolerated well. Patient sat in office for 10 minutes after shot to ensure no reaction. Patient left in stable condition.   Routing to provider for final review. Patient agreeable to disposition. Will close encounter.

## 2017-08-19 DIAGNOSIS — M530 Cervicocranial syndrome: Secondary | ICD-10-CM | POA: Diagnosis not present

## 2017-08-19 DIAGNOSIS — M9901 Segmental and somatic dysfunction of cervical region: Secondary | ICD-10-CM | POA: Diagnosis not present

## 2017-09-07 ENCOUNTER — Encounter: Payer: Self-pay | Admitting: Obstetrics and Gynecology

## 2017-09-07 ENCOUNTER — Ambulatory Visit (INDEPENDENT_AMBULATORY_CARE_PROVIDER_SITE_OTHER): Payer: 59 | Admitting: Obstetrics and Gynecology

## 2017-09-07 ENCOUNTER — Other Ambulatory Visit (HOSPITAL_COMMUNITY)
Admission: RE | Admit: 2017-09-07 | Discharge: 2017-09-07 | Disposition: A | Discharge status: Home / Self Care | Payer: 59 | Source: Ambulatory Visit | Attending: Obstetrics and Gynecology | Admitting: Obstetrics and Gynecology

## 2017-09-07 ENCOUNTER — Other Ambulatory Visit: Payer: Self-pay

## 2017-09-07 ENCOUNTER — Telehealth: Payer: Self-pay | Admitting: Obstetrics and Gynecology

## 2017-09-07 VITALS — BP 116/78 | HR 76 | Resp 16 | Ht 65.0 in | Wt 220.0 lb

## 2017-09-07 DIAGNOSIS — Z3009 Encounter for other general counseling and advice on contraception: Secondary | ICD-10-CM

## 2017-09-07 DIAGNOSIS — Z124 Encounter for screening for malignant neoplasm of cervix: Secondary | ICD-10-CM | POA: Insufficient documentation

## 2017-09-07 DIAGNOSIS — Z5181 Encounter for therapeutic drug level monitoring: Secondary | ICD-10-CM | POA: Diagnosis not present

## 2017-09-07 NOTE — Telephone Encounter (Signed)
Patient stated that she is having break through bleeding. Patient is taking the 1.5 Loestrin. Patient stated that Dr. Quincy Simmonds advised her if she is taking 1.5/30 then she would want to order a pelvic ultrasound.

## 2017-09-07 NOTE — Progress Notes (Signed)
GYNECOLOGY  VISIT   HPI: 22 y.o.   Single  Caucasian  female   G0P0000 with Patient's last menstrual period was 08/28/2017.   here for 3 month OCP f/u; patient complains of still having irregular menses. Really trying to control mood instability by taking continuous OPCs.   Mother is present for a large portion of the visit today.  For the last year is unpredictible with moods.  Taking Viibryd, Lamictal, and Neurontin.   Breakthough bleeding on LoLoEstrin.  Emotional changes and depression with Yaz.  Switched to Loestrin 1/20 and still had breakthrough bleeding. Received a new Rx for Loestrin 1.5/30.  Not sure that this is what she is currently taking.   Brings in a bleeding calendar. Bleeding 1/5, 1/6, 1/31, 2/4, 2/20, 2/21, 2/24, 2/25, 2/28, 3/3, 3/6, 3/9, 4/26, 5/1, and 5/2. Feels like her menses last 7 - 10 days.   Stopped pills for 4 days intentionally on 2/21 so she skipped 4 pills to have a period. Had a lot of cramping.    Not comfortable using a tampon.  Does use vibrator and has bleeding after use.  Never sexually active with a partner.   TSH 2.43 02/05/17.   GYNECOLOGIC HISTORY: Patient's last menstrual period was 08/28/2017. Contraception:  OCP Menopausal hormone therapy:  n/a Last mammogram:  n/a Last pap smear:   01/26/15 Negative        OB History    Gravida  0   Para  0   Term  0   Preterm  0   AB  0   Living  0     SAB  0   TAB  0   Ectopic  0   Multiple  0   Live Births  0              Patient Active Problem List   Diagnosis Date Noted  . Left foot pain 09/04/2016  . Fatigue due to depression 06/26/2014  . Insomnia secondary to anxiety 05/01/2014  . Hypersomnia, periodic 05/01/2014  . Recurrent hypersomnia 04/25/2014  . Asperger's syndrome 03/01/2014  . Insomnia due to medical condition 03/01/2014  . Obesity 03/01/2014  . Parasomnia overlap disorder 03/01/2014  . Asperger syndrome 12/20/2012  . Other abnormal glucose  02/10/2011    Past Medical History:  Diagnosis Date  . 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 Asperger's syndrome   . Dermatitis   . Dysmenorrhea   . Headache   . Hyperlipidemia 2018  . Menorrhagia   . Restless legs syndrome (RLS)     Past Surgical History:  Procedure Laterality Date  . TONSILECTOMY/ADENOIDECTOMY WITH MYRINGOTOMY    . WISDOM TOOTH EXTRACTION  09/02/12    Current Outpatient Medications  Medication Sig Dispense Refill  . Biotin 10 MG CAPS Take by mouth.    . cetirizine (ZYRTEC) 10 MG tablet Take 10 mg by mouth daily.    . Cholecalciferol (VITAMIN D3) 1000 units CAPS Take 1 capsule by mouth daily.    . cloNIDine (CATAPRES) 0.1 MG tablet Take 1 tablet by mouth as needed.    . gabapentin (NEURONTIN) 600 MG tablet Take 600 mg by mouth 2 (two) times daily.     Marland Kitchen LAMICTAL 100 MG tablet Take 1.5 tablets by mouth daily.    . Melatonin 10 MG CAPS Take 1 tablet by mouth at bedtime.    . Norethindrone Acetate-Ethinyl Estradiol (LOESTRIN 1.5/30, 21,) 1.5-30 MG-MCG tablet Take 1 tablet by mouth daily. 3 Package 2  .  PREVIDENT 5000 BOOSTER PLUS 1.1 % PSTE APPLY THIN RIBBON OF PASTE ON TOOTHBRUSH NIGHTLY BEFORE BED DO NOT RINSE  6  . Vilazodone HCl (VIIBRYD) 40 MG TABS Take 40 mg by mouth daily.     No current facility-administered medications for this visit.      ALLERGIES: Corn-containing products; Doxycycline; Peanut-containing drug products; and Latex  Family History  Problem Relation Age of Onset  . Hypertension Father   . Kidney Stones Mother   . Kidney Stones Sister   . Thyroid disease Maternal Grandmother   . Depression Maternal Grandmother   . Diabetes Maternal Grandfather   . CVA Maternal Grandfather   . Cancer Maternal Grandfather        unknown primary, ? melanoma  . Diabetes Paternal Grandmother   . Hypertension Paternal Grandmother   . Diabetes Paternal Grandfather   . Hypertension Paternal Grandfather   . Cancer Paternal Grandfather         leukemia    Social History   Socioeconomic History  . Marital status: Single    Spouse name: Not on file  . Number of children: Not on file  . Years of education: Not on file  . Highest education level: Not on file  Occupational History  . Not on file  Social Needs  . Financial resource strain: Not on file  . Food insecurity:    Worry: Not on file    Inability: Not on file  . Transportation needs:    Medical: Not on file    Non-medical: Not on file  Tobacco Use  . Smoking status: Never Smoker  . Smokeless tobacco: Never Used  Substance and Sexual Activity  . Alcohol use: No  . Drug use: No  . Sexual activity: Never    Birth control/protection: Pill    Comment: LoLoestrin  Lifestyle  . Physical activity:    Days per week: Not on file    Minutes per session: Not on file  . Stress: Not on file  Relationships  . Social connections:    Talks on phone: Not on file    Gets together: Not on file    Attends religious service: Not on file    Active member of club or organization: Not on file    Attends meetings of clubs or organizations: Not on file    Relationship status: Not on file  . Intimate partner violence:    Fear of current or ex partner: Not on file    Emotionally abused: Not on file    Physically abused: Not on file    Forced sexual activity: Not on file  Other Topics Concern  . Not on file  Social History Narrative   Right handed, Caffeine barely, HS grad.  Looking for work, Lives at home.      Review of Systems  Constitutional: Negative.   HENT: Negative.   Eyes: Negative.   Respiratory: Negative.   Cardiovascular: Negative.   Gastrointestinal: Negative.   Endocrine: Negative.   Genitourinary:       Unscheduled bleeding and spotting   Musculoskeletal: Negative.   Skin: Negative.   Allergic/Immunologic: Negative.   Neurological: Positive for headaches.       Excessive crying  Hematological: Negative.   Psychiatric/Behavioral: Negative.      PHYSICAL EXAMINATION:    BP 116/78 (BP Location: Right Arm, Patient Position: Sitting, Cuff Size: Large)   Pulse 76   Resp 16   Ht 5\' 5"  (1.651 m)   Wt 220 lb (  99.8 kg)   LMP 08/28/2017   BMI 36.61 kg/m     General appearance: alert, cooperative and appears stated age   Pelvic: External genitalia:  no lesions              Urethra:  normal appearing urethra with no masses, tenderness or lesions              Bartholins and Skenes: normal                 Vagina: normal appearing vagina with normal color and discharge, no lesions              Cervix:  Ectropion friable.   Pap taken.                 Bimanual Exam:  Uterus:  normal size, contour, position, consistency, mobility, non-tender              Adnexa: no mass, fullness, tenderness                Chaperone was present for exam.  ASSESSMENT  Breakthrough bleeding on combined OCPs.  Depression and anxiety.  Emotional instability on current OCP and medication regimen.  Friable cervix.  Cervical cancer screening.   PLAN  Patient will confirm her OCP dosage at home that she taking. If it is Loestrin 1.5/30, we will plan for a pelvic ultrasound.  If she is taking Loestrin 1/20, will increase her up to the intended 1.5/30 dosage for 3 months and then have a recheck. (Already has Rx.) We discussed interaction of her Lamictal and her OCPs decreasing the effectiveness of her Lamictal. We discussed alternative medications for cycle control including NuvaRing and Kyleena IUD, the latter of which will reduce her hormonal exposure overall.  Pap and reflex HR HPV testing.   An After Visit Summary was printed and given to the patient.  _25_____ minutes face to face time of which over 50% was spent in counseling.

## 2017-09-07 NOTE — Patient Instructions (Addendum)
Your birth control pill should be Loestrin 1.5/30.  Take one by mouth daily and skip the placebo pills.  If you are already taking this dosage, we will need to order a pelvic ultrasound.

## 2017-09-07 NOTE — Telephone Encounter (Signed)
Spoke with patient and she confirmed she is currently taking Loestrin 1.5/30. Per Dr.Silva's note from today patient will need to schedule PUS. Will confirm this with Dr.Silva and have her place order and call patient back to schedule. Routed to provider.

## 2017-09-08 ENCOUNTER — Other Ambulatory Visit: Payer: Self-pay | Admitting: Obstetrics and Gynecology

## 2017-09-08 DIAGNOSIS — N926 Irregular menstruation, unspecified: Secondary | ICD-10-CM

## 2017-09-08 NOTE — Telephone Encounter (Signed)
I placed order for pelvic ultrasound for irregular menses.  This will need to be precerted and then scheduled.

## 2017-09-08 NOTE — Telephone Encounter (Signed)
Routing for Suzy/Rosa for precert and scheduling.    Cc: Magdalene Patricia

## 2017-09-08 NOTE — Telephone Encounter (Signed)
Call placed to patient to review benefits and schedule recommended ultrasound. Left voicemail message requesting a return call °

## 2017-09-09 DIAGNOSIS — M9901 Segmental and somatic dysfunction of cervical region: Secondary | ICD-10-CM | POA: Diagnosis not present

## 2017-09-09 DIAGNOSIS — M530 Cervicocranial syndrome: Secondary | ICD-10-CM | POA: Diagnosis not present

## 2017-09-09 LAB — CYTOLOGY - PAP
Diagnosis: NEGATIVE
HPV (WINDOPATH): NOT DETECTED

## 2017-09-09 NOTE — Telephone Encounter (Signed)
Second call placed to patient to review benefits and schedule recommended ultrasound. Left voicemail message requesting a return call °

## 2017-09-09 NOTE — Telephone Encounter (Signed)
Patients mother, Lelan Pons returned call at patients request, mother is listed on the most recent Environmental consultant. Spoke with Ms Egle regarding benefit for recommended ultrasound, She acknowledges understooding and is agreeable.  Patient is scheduled 09/24/17 with Dr Quincy Simmonds. Ms Bhullar is aware of patients appointment date, arrival time and  cancellation policy.  No further questions.   Forwarding to Dr Quincy Simmonds for final review

## 2017-09-09 NOTE — Telephone Encounter (Signed)
Thank you for scheduling.  Encounter closed.

## 2017-09-24 ENCOUNTER — Other Ambulatory Visit: Payer: 59

## 2017-09-24 ENCOUNTER — Ambulatory Visit (INDEPENDENT_AMBULATORY_CARE_PROVIDER_SITE_OTHER): Payer: 59 | Admitting: Obstetrics and Gynecology

## 2017-09-24 ENCOUNTER — Other Ambulatory Visit: Payer: Self-pay

## 2017-09-24 ENCOUNTER — Ambulatory Visit (INDEPENDENT_AMBULATORY_CARE_PROVIDER_SITE_OTHER): Payer: 59

## 2017-09-24 ENCOUNTER — Encounter: Payer: Self-pay | Admitting: Obstetrics and Gynecology

## 2017-09-24 VITALS — BP 100/70 | HR 80 | Resp 16 | Ht 65.0 in | Wt 220.0 lb

## 2017-09-24 DIAGNOSIS — N926 Irregular menstruation, unspecified: Secondary | ICD-10-CM

## 2017-09-24 DIAGNOSIS — R4586 Emotional lability: Secondary | ICD-10-CM | POA: Diagnosis not present

## 2017-09-24 MED ORDER — NORETHINDRONE-ETH ESTRADIOL 0.4-35 MG-MCG PO TABS: 1.0000 | ORAL_TABLET | Freq: Every day | ORAL | 2 refills | Status: DC

## 2017-09-24 NOTE — Progress Notes (Signed)
GYNECOLOGY  VISIT   HPI: 22 y.o.   Single  Caucasian  female   G0P0000 with Patient's last menstrual period was 08/28/2017.   here for pelvic ultrasound for irregular menses and sporadic bleeding.    On Loestrin 1.5/30.   States her number one goal is to stabilize her mood.   Liked LoLoestrin in the past but she developed mood swings and breakthrough bleeding.   Really does not want to discontinue her birth control overall.  Mood is much better on her OCPs in general.  Will see her psychiatrist for a recheck on June 21.   GYNECOLOGIC HISTORY: Patient's last menstrual period was 08/28/2017. Contraception:  OP Menopausal hormone therapy:  n/a Last mammogram:  n/a Last pap smear:   09/07/17 Pap and HR HPV negative        OB History    Gravida  0   Para  0   Term  0   Preterm  0   AB  0   Living  0     SAB  0   TAB  0   Ectopic  0   Multiple  0   Live Births  0              Patient Active Problem List   Diagnosis Date Noted  . Left foot pain 09/04/2016  . Fatigue due to depression 06/26/2014  . Insomnia secondary to anxiety 05/01/2014  . Hypersomnia, periodic 05/01/2014  . Recurrent hypersomnia 04/25/2014  . Asperger's syndrome 03/01/2014  . Insomnia due to medical condition 03/01/2014  . Obesity 03/01/2014  . Parasomnia overlap disorder 03/01/2014  . Asperger syndrome 12/20/2012  . Other abnormal glucose 02/10/2011    Past Medical History:  Diagnosis Date  . 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 Asperger's syndrome   . Dermatitis   . Dysmenorrhea   . Headache   . Hyperlipidemia 2018  . Menorrhagia   . Restless legs syndrome (RLS)     Past Surgical History:  Procedure Laterality Date  . TONSILECTOMY/ADENOIDECTOMY WITH MYRINGOTOMY    . WISDOM TOOTH EXTRACTION  09/02/12    Current Outpatient Medications  Medication Sig Dispense Refill  . Biotin 10 MG CAPS Take by mouth.    . cetirizine (ZYRTEC) 10 MG tablet Take 10 mg by  mouth daily.    . Cholecalciferol (VITAMIN D3) 1000 units CAPS Take 1 capsule by mouth daily.    . cloNIDine (CATAPRES) 0.1 MG tablet Take 1 tablet by mouth as needed.    . gabapentin (NEURONTIN) 600 MG tablet Take 600 mg by mouth 2 (two) times daily.     Marland Kitchen LAMICTAL 100 MG tablet Take 1.5 tablets by mouth daily.    . Melatonin 10 MG CAPS Take 1 tablet by mouth at bedtime.    . Norethindrone Acetate-Ethinyl Estradiol (LOESTRIN 1.5/30, 21,) 1.5-30 MG-MCG tablet Take 1 tablet by mouth daily. 3 Package 2  . PREVIDENT 5000 BOOSTER PLUS 1.1 % PSTE APPLY THIN RIBBON OF PASTE ON TOOTHBRUSH NIGHTLY BEFORE BED DO NOT RINSE  6  . Vilazodone HCl (VIIBRYD) 40 MG TABS Take 40 mg by mouth daily.     No current facility-administered medications for this visit.      ALLERGIES: Corn-containing products; Doxycycline; Peanut-containing drug products; and Latex  Family History  Problem Relation Age of Onset  . Hypertension Father   . Kidney Stones Mother   . Kidney Stones Sister   . Thyroid disease Maternal Grandmother   .  Depression Maternal Grandmother   . Diabetes Maternal Grandfather   . CVA Maternal Grandfather   . Cancer Maternal Grandfather        unknown primary, ? melanoma  . Diabetes Paternal Grandmother   . Hypertension Paternal Grandmother   . Diabetes Paternal Grandfather   . Hypertension Paternal Grandfather   . Cancer Paternal Grandfather        leukemia    Social History   Socioeconomic History  . Marital status: Single    Spouse name: Not on file  . Number of children: Not on file  . Years of education: Not on file  . Highest education level: Not on file  Occupational History  . Not on file  Social Needs  . Financial resource strain: Not on file  . Food insecurity:    Worry: Not on file    Inability: Not on file  . Transportation needs:    Medical: Not on file    Non-medical: Not on file  Tobacco Use  . Smoking status: Never Smoker  . Smokeless tobacco: Never Used    Substance and Sexual Activity  . Alcohol use: No  . Drug use: No  . Sexual activity: Never    Birth control/protection: Pill    Comment: LoLoestrin  Lifestyle  . Physical activity:    Days per week: Not on file    Minutes per session: Not on file  . Stress: Not on file  Relationships  . Social connections:    Talks on phone: Not on file    Gets together: Not on file    Attends religious service: Not on file    Active member of club or organization: Not on file    Attends meetings of clubs or organizations: Not on file    Relationship status: Not on file  . Intimate partner violence:    Fear of current or ex partner: Not on file    Emotionally abused: Not on file    Physically abused: Not on file    Forced sexual activity: Not on file  Other Topics Concern  . Not on file  Social History Narrative   Right handed, Caffeine barely, HS grad.  Looking for work, Lives at home.      Review of Systems  Constitutional: Negative.   HENT: Negative.   Eyes: Negative.   Respiratory: Negative.   Cardiovascular: Negative.   Gastrointestinal: Negative.   Endocrine: Negative.   Genitourinary: Negative.   Musculoskeletal: Negative.   Skin: Negative.   Allergic/Immunologic: Negative.   Neurological: Negative.   Hematological: Negative.   Psychiatric/Behavioral: Negative.     PHYSICAL EXAMINATION:    BP 100/70 (BP Location: Right Arm, Patient Position: Sitting, Cuff Size: Large)   Pulse 80   Resp 16   Ht 5\' 5"  (1.651 m)   Wt 220 lb (99.8 kg)   LMP 08/28/2017   BMI 36.61 kg/m     General appearance: alert, cooperative and appears stated age   Pelvic US  Uterus normal.  EMS 2.88 mm.  Left ovary with collapsing Cl cyst.  Bilateral ovaries with peripheral follicles.  No free fluid.  ASSESSMENT  Mood swings.  Breakthrough bleeding on current OCP with evidence of ovulation.   PLAN  We did discuss the challenge of treating her mood swings.  Increasing her birth control  may have an effect on her mood stabilizers.  We did discuss the possibility of stopping her birth control medication completely as a trial.  We discussed alternatives  such as a progesterone IUD.   For now, switch to Ovcon 35.  FU in about 3 months.    An After Visit Summary was printed and given to the patient.  _25____ minutes face to face time of which over 50% was spent in counseling.

## 2017-09-24 NOTE — Patient Instructions (Signed)
Ethinyl Estradiol; Norethindrone; Ferrous fumarate chewable tablets What is this medicine? ETHINYL ESTRADIOL; NORETHINDRONE; FERROUS FUMARATE (ETH in il es tra DYE ole; nor eth IN drone; FER [place] FUE ma rate) is an oral contraceptive. The products combine two types of female hormones, an estrogen and a progestin. They are used to prevent ovulation and pregnancy. This medicine may be used for other purposes; ask your health care provider or pharmacist if you have questions. COMMON BRAND NAME(S): Femcon Fe, [place] Fe, Kaitlib Fe, [place] Fe, Ovcon 35, WYMZYA, [place] Fe, ZEOSA What should I tell my health care provider before I take this medicine? They need to know if you have any of these conditions: -abnormal vaginal bleeding -blood vessel disease -breast, cervical, endometrial, ovarian, liver, or uterine cancer -diabetes -gallbladder disease -heart disease or recent heart attack -high blood pressure -high cholesterol -history of blood clots -kidney disease -liver disease -migraine headaches -smoke tobacco -stroke -systemic lupus erythematosus (SLE) -an unusual or allergic reaction to estrogens, progestins, other medicines, foods, dyes, or preservatives -pregnant or trying to get pregnant -breast-feeding How should I use this medicine? Take this medicine by mouth. Take tablet whole or chew it completely before swallowing. If you chew this medicine, drink a full glass of water after. Follow the directions on the prescription label. To reduce nausea, this medicine may be taken with food. Take this medicine at the same time each day and in the order directed on the package. Do not take your medicine more often than directed. A patient package insert for the product will be given with each prescription and refill. Read this sheet carefully each time. The sheet may change frequently. Contact your pediatrician regarding the use of this medicine in children. Special care may be needed. This  medicine has been used in female children who have started having menstrual periods. Overdosage: If you think you have taken too much of this medicine contact a poison control center or emergency room at once. NOTE: This medicine is only for you. Do not share this medicine with others. What if I miss a dose? If you miss a dose, refer to the patient information sheet you received with your medicine for direction. If you miss more than one pill, this medicine may not be as effective and you may need to use another form of birth control. What may interact with this medicine? Do not take this medicine with the following medication: -dasabuvir; ombitasvir; paritaprevir; ritonavir -ombitasvir; paritaprevir; ritonavir This medicine may also interact with the following medications: -acetaminophen -antibiotics or medicines for infections, especially rifampin, rifabutin, rifapentine, and griseofulvin, and possibly penicillins or tetracyclines -aprepitant -ascorbic acid (vitamin C) -atorvastatin -barbiturate medicines, such as phenobarbital -bosentan -carbamazepine -caffeine -clofibrate -cyclosporine -dantrolene -doxercalciferol -felbamate -grapefruit juice -hydrocortisone -medicines for anxiety or sleeping problems, such as diazepam or temazepam -medicines for diabetes, including pioglitazone -mineral oil -modafinil -mycophenolate -nefazodone -oxcarbazepine -phenytoin -prednisolone -ritonavir or other medicines for HIV infection or AIDS -rosuvastatin -selegiline -soy isoflavones supplements -St. John's wort -tamoxifen or raloxifene -theophylline -thyroid hormones -topiramate -warfarin This list may not describe all possible interactions. Give your health care provider a list of all the medicines, herbs, non-prescription drugs, or dietary supplements you use. Also tell them if you smoke, drink alcohol, or use illegal drugs. Some items may interact with your medicine. What should I  watch for while using this medicine? Visit your doctor or health care professional for regular checks on your progress. You will need a regular breast and pelvic exams and Pap smears  while on this medicine. Use an additional method of contraception during the first cycle that you take this medicine. If you have any reason to think you are pregnant, stop taking this medicine right away and contact your doctor or health care professional. If you are taking this medicine for hormone related problems, it may take several cycles of use to see improvement in your condition. Smoking increases the risk of getting a blood clot or having a stroke while you are taking birth control pills, especially if you are more than 24 years old. You are strongly advised not to smoke. This medicine can make your body retain fluid, making your fingers, hands, or ankles swell. Your blood pressure can go up. Contact your doctor or health care professional if you feel you are retaining fluid. This medicine can make you more sensitive to the sun. Keep out of the sun. If you cannot avoid being in the sun, wear protective clothing and use sunscreen. Do not use sun lamps or tanning beds/booths. If you wear contact lenses and notice visual changes, or if the lenses begin to feel uncomfortable, consult your eye care specialist. In some women, tenderness, swelling, or minor bleeding of the gums may occur. Notify your dentist if this happens. Brushing and flossing your teeth regularly may help limit this. See your dentist regularly and inform your dentist of the medicines you are taking. If you are going to have elective surgery, you may need to stop taking this medicine before the surgery. Consult your health care professional for advice. This medicine does not protect you against HIV infection (AIDS) or any other sexually transmitted diseases. What side effects may I notice from receiving this medicine? Side effects that you should  report to your doctor or health care professional as soon as possible: -allergic reactions like skin rash, itching or hives, swelling of the face, lips, or tongue -breast tissue changes or discharge -changes in vaginal bleeding during your period or between your periods -changes in vision -chest pain -confusion -coughing up blood -dizziness -feeling faint or lightheaded -headaches or migraines -leg, arm or groin pain -loss of balance or coordination -severe or sudden headaches -severe stomach pain -sudden shortness of breath -sudden numbness or weakness of the face, arm or leg -symptoms of vaginal infection like itching, irritation or unusual discharge -tenderness in the upper abdomen -trouble speaking or understanding -vomiting -yellowing of the eyes or skin Side effects that usually do not require medical attention (report to your doctor or health care professional if they continue or are bothersome): -breast tenderness -mood changes, anxiety, depression, frustration, anger, or emotional outbursts -increased sensitivity to sun or ultraviolet light -nausea -skin acne, or brown spots on the skin -weight gain (slight) This list may not describe all possible side effects. Call your doctor for medical advice about side effects. You may report side effects to FDA at 1-800-FDA-1088. Where should I keep my medicine? Keep out of the reach of children. Store at room temperature between 15 and 30 degrees C (59 and 86 degrees F). Throw away any unused medicine after the expiration date. NOTE: This sheet is a summary. It may not cover all possible information. If you have questions about this medicine, talk to your doctor, pharmacist, or health care provider.  2018 [employer]vier/Gold Standard (2015-12-31 08:07:09)

## 2017-10-07 DIAGNOSIS — M530 Cervicocranial syndrome: Secondary | ICD-10-CM | POA: Diagnosis not present

## 2017-10-07 DIAGNOSIS — M9901 Segmental and somatic dysfunction of cervical region: Secondary | ICD-10-CM | POA: Diagnosis not present

## 2017-11-02 DIAGNOSIS — M530 Cervicocranial syndrome: Secondary | ICD-10-CM | POA: Diagnosis not present

## 2017-11-02 DIAGNOSIS — M9901 Segmental and somatic dysfunction of cervical region: Secondary | ICD-10-CM | POA: Diagnosis not present

## 2017-12-01 ENCOUNTER — Other Ambulatory Visit: Payer: Self-pay | Admitting: Obstetrics and Gynecology

## 2017-12-01 NOTE — Telephone Encounter (Signed)
Medication refill request: Hulda Humphrey  Last AEX:  02/05/17 Next AEX: 02/17/18 Last MMG (if hormonal medication request): NA Refill authorized: Please refill if appropriate.

## 2017-12-09 ENCOUNTER — Other Ambulatory Visit: Payer: Self-pay

## 2017-12-09 ENCOUNTER — Encounter: Payer: Self-pay | Admitting: Obstetrics and Gynecology

## 2017-12-09 ENCOUNTER — Ambulatory Visit (INDEPENDENT_AMBULATORY_CARE_PROVIDER_SITE_OTHER): Payer: 59 | Admitting: Obstetrics and Gynecology

## 2017-12-09 VITALS — BP 126/80 | HR 72 | Resp 14 | Ht 65.0 in | Wt 219.2 lb

## 2017-12-09 DIAGNOSIS — R519 Headache, unspecified: Secondary | ICD-10-CM

## 2017-12-09 DIAGNOSIS — Z3041 Encounter for surveillance of contraceptive pills: Secondary | ICD-10-CM | POA: Diagnosis not present

## 2017-12-09 DIAGNOSIS — R51 Headache: Secondary | ICD-10-CM

## 2017-12-09 MED ORDER — NORETHINDRONE-ETH ESTRADIOL 0.4-35 MG-MCG PO TABS: 1.0000 | ORAL_TABLET | Freq: Every day | ORAL | 0 refills | Status: DC

## 2017-12-09 NOTE — Progress Notes (Signed)
GYNECOLOGY  VISIT   HPI: 22 y.o.   Single  Caucasian  female   [id number] with Patient's last menstrual period was 12/02/2017.   here for   3 month Ovcon recheck   Taking OCP continuously.  Ran out prior to this visit.  Did not take active pills for 4 days.   Having only brown spotting for her menstruation while taking it continuously.  Feels like this is slowing down however. The brown bleeding lasted for 3 days the last cycle and then 3 - 4 days when she ran out.   Really wants to achieve amenorrhea.   Taking OCPs for mood stabilization.  Mood feels like it is better even through she is under some personal stress right now.   She is having headaches with photophobia and digestive changes.  States she has silent migraines not usually with the headache.  Has stiff neck, feels hard to focus and complete tasks like washing her arm.  No aura either visual or auditory.   Did not do well with Yaz and had emotional changes and depression.   GYNECOLOGIC HISTORY: Patient's last menstrual period was 12/02/2017. Contraception:  Ovcon n Menopausal hormone therapy:  none Last mammogram:  n/a Last pap smear:   09-07-17 negative, HR HPV negative         OB History    Gravida  0   Para  0   Term  0   Preterm  0   AB  0   Living  0     SAB  0   TAB  0   Ectopic  0   Multiple  0   Live Births  0              Patient Active Problem List   Diagnosis Date Noted  . Left foot pain 09/04/2016  . Fatigue due to depression 06/26/2014  . Insomnia secondary to anxiety 05/01/2014  . Hypersomnia, periodic 05/01/2014  . Recurrent hypersomnia 04/25/2014  . Asperger's syndrome 03/01/2014  . Insomnia due to medical condition 03/01/2014  . Obesity 03/01/2014  . Parasomnia overlap disorder 03/01/2014  . Asperger syndrome 12/20/2012  . Other abnormal glucose 02/10/2011    Past Medical History:  Diagnosis Date  . 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 Asperger's  syndrome   . Dermatitis   . Dysmenorrhea   . Headache   . Hyperlipidemia 2018  . Menorrhagia   . Restless legs syndrome (RLS)     Past Surgical History:  Procedure Laterality Date  . TONSILECTOMY/ADENOIDECTOMY WITH MYRINGOTOMY    . WISDOM TOOTH EXTRACTION  09/02/12    Current Outpatient Medications  Medication Sig Dispense Refill  . BALZIVA 0.4-35 MG-MCG tablet TAKE 1 TABLET BY MOUTH EVERY DAY 28 tablet 2  . Biotin 10 MG CAPS Take by mouth.    . cetirizine (ZYRTEC) 10 MG tablet Take 10 mg by mouth daily.    . Cholecalciferol (VITAMIN D3) 1000 units CAPS Take 1 capsule by mouth daily.    . cloNIDine (CATAPRES) 0.1 MG tablet Take 1 tablet by mouth as needed.    . gabapentin (NEURONTIN) 600 MG tablet Take 600 mg by mouth 2 (two) times daily.     Marland Kitchen LAMICTAL 100 MG tablet Take 1.5 tablets by mouth daily.    . Melatonin 10 MG CAPS Take 1 tablet by mouth at bedtime.    Marland Kitchen PREVIDENT 5000 BOOSTER PLUS 1.1 % PSTE APPLY THIN RIBBON OF PASTE ON TOOTHBRUSH NIGHTLY BEFORE  BED DO NOT RINSE  6  . Vilazodone HCl (VIIBRYD) 40 MG TABS Take 40 mg by mouth daily.     No current facility-administered medications for this visit.      ALLERGIES: Corn-containing products; Doxycycline; Peanut-containing drug products; and Latex  Family History  Problem Relation Age of Onset  . Hypertension Father   . Kidney Stones Mother   . Kidney Stones Sister   . Thyroid disease Maternal Grandmother   . Depression Maternal Grandmother   . Diabetes Maternal Grandfather   . CVA Maternal Grandfather   . Cancer Maternal Grandfather        unknown primary, ? melanoma  . Diabetes Paternal Grandmother   . Hypertension Paternal Grandmother   . Diabetes Paternal Grandfather   . Hypertension Paternal Grandfather   . Cancer Paternal Grandfather        leukemia    Social History   Socioeconomic History  . Marital status: Single    Spouse name: Not on file  . Number of children: Not on file  . Years of education:  Not on file  . Highest education level: Not on file  Occupational History  . Not on file  Social Needs  . Financial resource strain: Not on file  . Food insecurity:    Worry: Not on file    Inability: Not on file  . Transportation needs:    Medical: Not on file    Non-medical: Not on file  Tobacco Use  . Smoking status: Never Smoker  . Smokeless tobacco: Never Used  Substance and Sexual Activity  . Alcohol use: No  . Drug use: No  . Sexual activity: Never    Birth control/protection: Pill    Comment: LoLoestrin  Lifestyle  . Physical activity:    Days per week: Not on file    Minutes per session: Not on file  . Stress: Not on file  Relationships  . Social connections:    Talks on phone: Not on file    Gets together: Not on file    Attends religious service: Not on file    Active member of club or organization: Not on file    Attends meetings of clubs or organizations: Not on file    Relationship status: Not on file  . Intimate partner violence:    Fear of current or ex partner: Not on file    Emotionally abused: Not on file    Physically abused: Not on file    Forced sexual activity: Not on file  Other Topics Concern  . Not on file  Social History Narrative   Right handed, Caffeine barely, HS grad.  Looking for work, Lives at home.      Review of Systems  Constitutional: Negative.   HENT: Negative.   Eyes: Negative.   Respiratory: Negative.   Cardiovascular: Negative.   Gastrointestinal: Negative.   Endocrine: Negative.   Genitourinary: Negative.   Musculoskeletal: Negative.   Allergic/Immunologic: Negative.   Neurological: Positive for headaches.  Hematological: Negative.   Psychiatric/Behavioral: Negative.     PHYSICAL EXAMINATION:    BP 126/80 (BP Location: Right Arm, Patient Position: Sitting, Cuff Size: Normal)   Pulse 72   Resp 14   Ht 5\' 5"  (1.651 m)   Wt 219 lb 4 oz (99.5 kg)   LMP 12/02/2017   BMI 36.49 kg/m     General appearance:  alert, cooperative and appears stated age   Chaperone was present for exam.  ASSESSMENT  Mood instability.  On COCs.  Intolerant of Yaz. Headaches.   PLAN  We discussed options for care - continuing her current OCPs versus considering progesterone option such as an IUD.  Refill of Ovcon 35 4 packs and no refills until annual exam. Referral to neurology to define her HAs.  She knows she will need to stop her COCs if she has headaches that preclude her continuation - e.g. Migraines with aura.  AEX in October 2019.    An After Visit Summary was printed and given to the patient.  __25____ minutes face to face time of which over 50% was spent in counseling.

## 2017-12-17 DIAGNOSIS — M530 Cervicocranial syndrome: Secondary | ICD-10-CM | POA: Diagnosis not present

## 2017-12-17 DIAGNOSIS — M9901 Segmental and somatic dysfunction of cervical region: Secondary | ICD-10-CM | POA: Diagnosis not present

## 2018-01-07 DIAGNOSIS — M530 Cervicocranial syndrome: Secondary | ICD-10-CM | POA: Diagnosis not present

## 2018-01-07 DIAGNOSIS — M9901 Segmental and somatic dysfunction of cervical region: Secondary | ICD-10-CM | POA: Diagnosis not present

## 2018-01-27 DIAGNOSIS — M9901 Segmental and somatic dysfunction of cervical region: Secondary | ICD-10-CM | POA: Diagnosis not present

## 2018-01-27 DIAGNOSIS — M530 Cervicocranial syndrome: Secondary | ICD-10-CM | POA: Diagnosis not present

## 2018-01-31 IMAGING — DX DG FOOT COMPLETE 3+V*L*
3 series · 3 of 3 positions shown · non-contrast
Comparison: None.

CLINICAL DATA: Pain for 3 months

EXAM:
LEFT FOOT - COMPLETE 3+ VIEW

[foot ap]
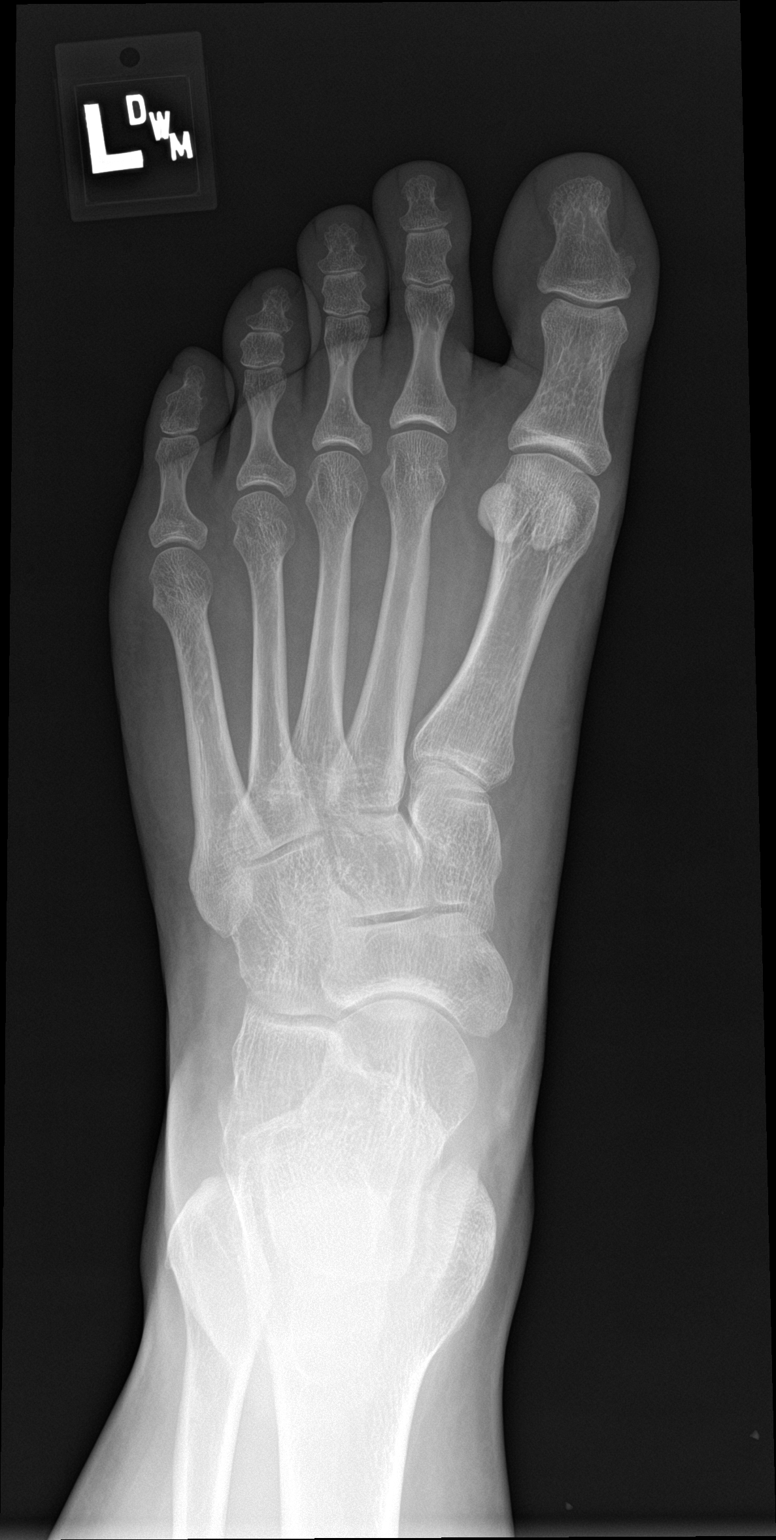

[foot obl]
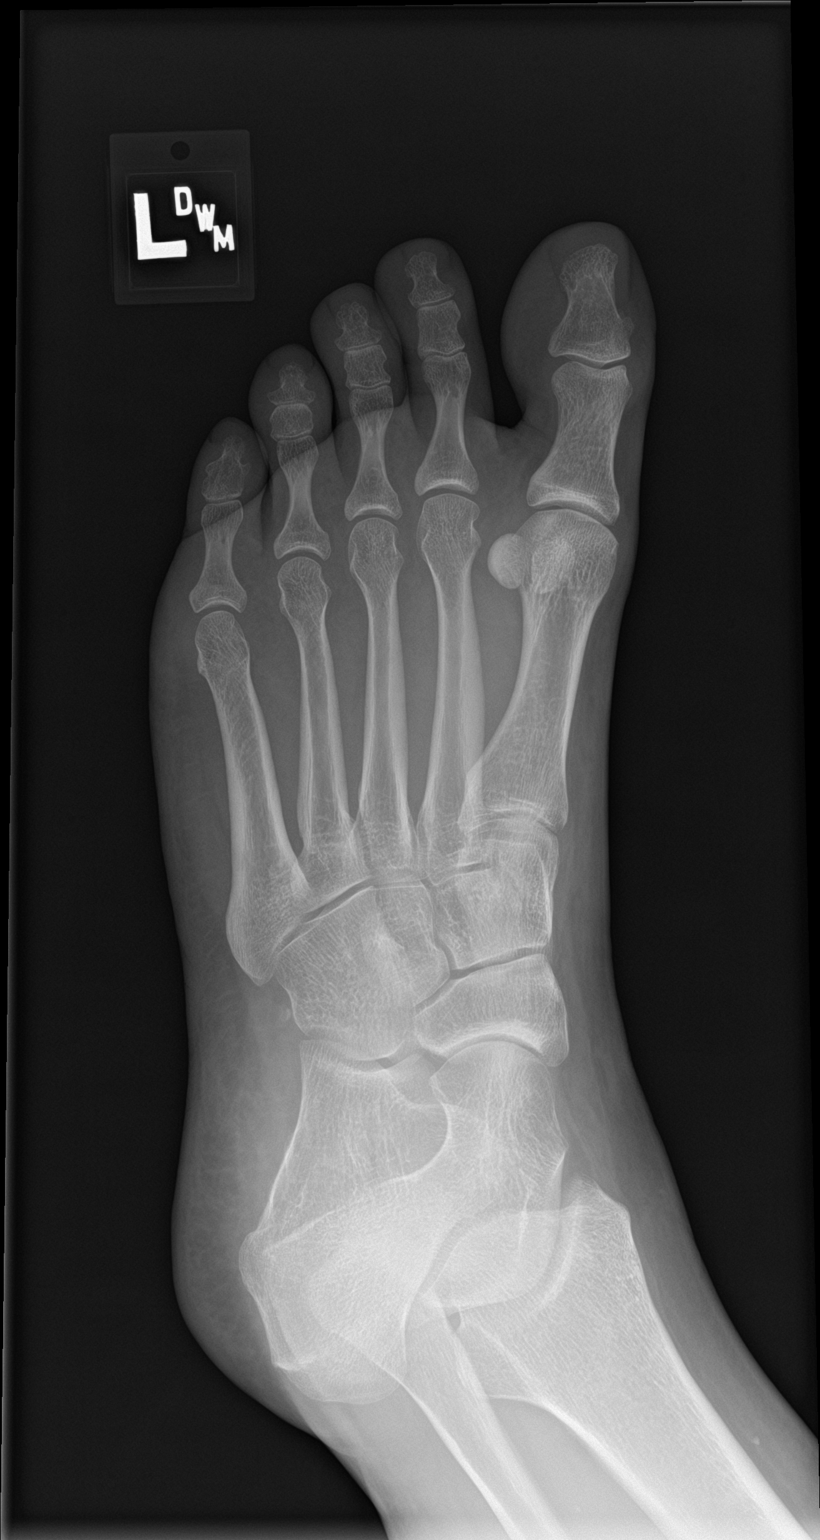

[foot lat]
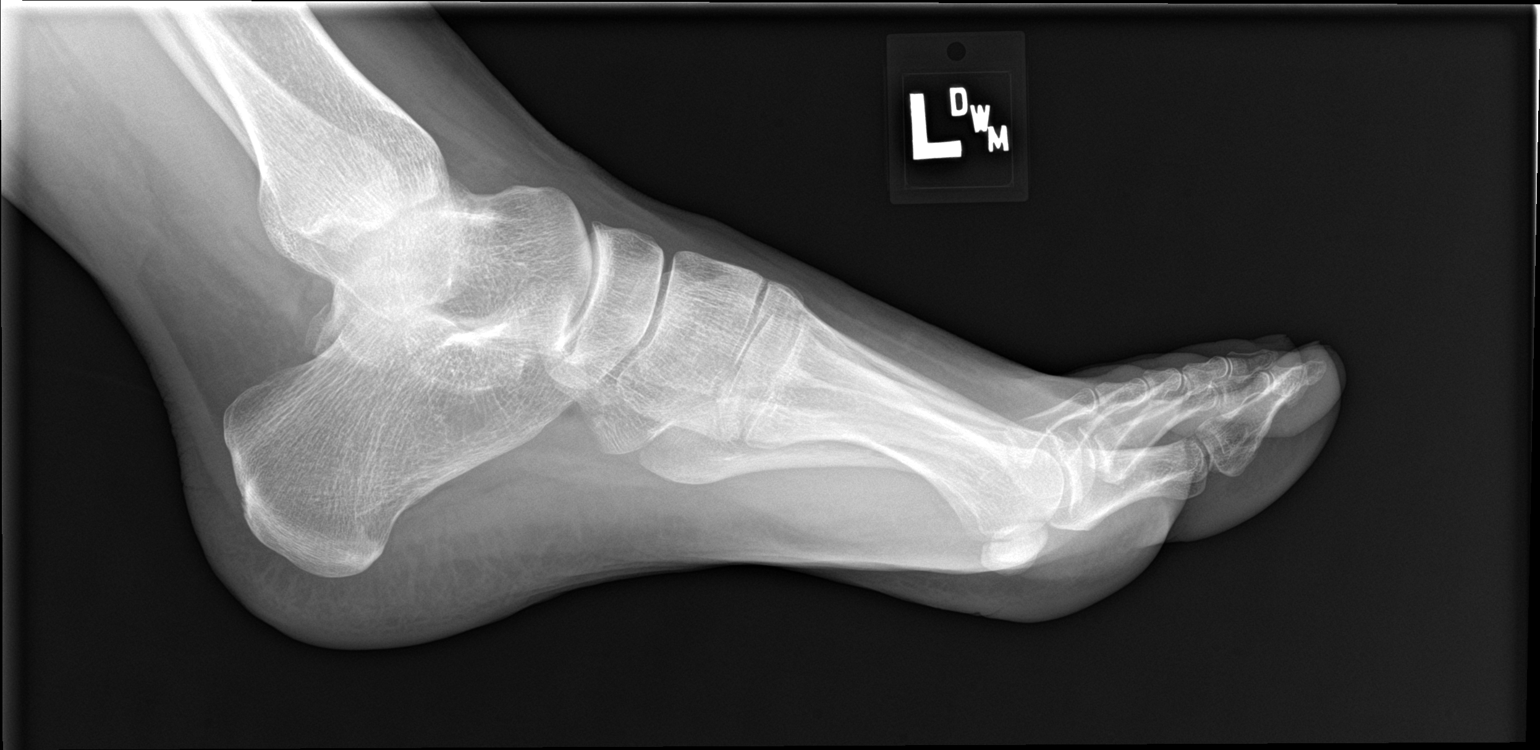

[3 of 3 positions shown; findings below may reference images not displayed]

FINDINGS: Frontal, oblique, and lateral views were obtained. There is no
fracture or dislocation. Joint spaces appear normal. No erosive
change.
IMPRESSION: No fracture or dislocation.  No evident arthropathy.

## 2018-02-04 ENCOUNTER — Encounter: Payer: Self-pay | Admitting: Neurology

## 2018-02-04 ENCOUNTER — Ambulatory Visit (INDEPENDENT_AMBULATORY_CARE_PROVIDER_SITE_OTHER): Payer: 59 | Admitting: Neurology

## 2018-02-04 VITALS — BP 132/87 | HR 76 | Ht 65.0 in | Wt 226.0 lb

## 2018-02-04 DIAGNOSIS — F5105 Insomnia due to other mental disorder: Secondary | ICD-10-CM | POA: Diagnosis not present

## 2018-02-04 DIAGNOSIS — F99 Mental disorder, not otherwise specified: Secondary | ICD-10-CM | POA: Diagnosis not present

## 2018-02-04 DIAGNOSIS — G43111 Migraine with aura, intractable, with status migrainosus: Secondary | ICD-10-CM

## 2018-02-04 MED ORDER — GABAPENTIN 600 MG PO TABS: 600.0000 mg | ORAL_TABLET | Freq: Two times a day (BID) | ORAL | 5 refills | Status: DC

## 2018-02-04 MED ORDER — CLONIDINE HCL 0.1 MG PO TABS: 0.1000 mg | ORAL_TABLET | ORAL | 1 refills | Status: AC | PRN

## 2018-02-04 NOTE — Progress Notes (Signed)
SLEEP MEDICINE CLINIC   Tina Wilcox MRN: [id number] DOB: 1996/04/08  Referring provider: Rowan Blase, PA-C Primary care provider: Sharilyn Sites, MD,  Tina Koch, DO neurologist at [facility] .      Reason for consult:  Recurrent transient altered awareness, diplopia, abnormal sleep .  This patient of Tina Wilcox is referred for a sleep evaluation.   HISTORY OF PRESENT ILLNESS: Tina Wilcox is a 22 year old right-handed woman with history of Asperger's syndrome, RLS, prior apnea, menorrhagia and dysmenorrhea who presents for dizziness.   Tina Wilcox had been tested for RLS and mild sleep apnea in supine position. This study was obtained at age 78 , in [place].   Tina Wilcox and her mother report that Tina Wilcox had difficulties with sleep at a young age. She would have night terrors waking up searching for her parents of her right in front of her.  She was also at times insomnic- these seem to come in cycles or episodes. I called the patient, reports that she feels often as if not fully awake but no longer fully asleep. She is reporting having vivid dreams and ongoing story but she cannot truly concentrate on the details of the dream content as she feels too tired to do so.  Over this year her sleep has evolved : She seems not to be able to " get out of the dream"- I cannot focus on my dream and want to sleep. I feel frustrated by crazy and vivid, bizarre dreams that keep me from sleep. "  But she reports also , that she likes her dreams. She seems to drift off several times a day.  She reports that she naps most days, she did this while in school. But feels now it's a necessity. The naps can refresh her , most naps are lasting up to 2 hours. She has noted Bruxism and TMJ, snoring is reported .  Tina Wilcox goes to bed about midnight and sleeps until 8.30 AM. Sleep latency is usually 30 minutes, she takes melatonin. She avoids screen light in the bedroom.  Most nights she sleeps through until  morning, no nocturia , no confusional arousals, she finds herself in the same sleep position. She sleeps lateral and supine. She does not take caffeine. Takes regular breakfast , she is not exposed to a lot of daylight.  She is not an out of doors person.    05-01-14  The patient reports good success with Vybriid and has been more alert and less anxious. This may influence her sleep pattern, too.   Her sleep study was discussed- her PSG from  04-16-14 documented an AHI 4.6 and RDI of 7.2 . She has been sleepy all her life, her MSLT showed a mean sleep latency of 11 minutes and her REM latency was 210 minutes. Her MSLT was without any SREMs.  I will order a narcolepsy HLA, not change the antidepressants.  She want to drive and needs to take a stimulant modafinil to be safe.   06-26-14 Tina Wilcox is seen here today for a follow-up visit unfortunately the Provigil preparation at 100 mg daily did provoke headaches and also an anxiety / panic attack, she felt" a sense of doom". She had those before and was on Vibryd for this symptom. She felt hypervigilant at night, over 12 hors after taking it. She stopped.  She may tolerate a 50 mg dose, but unlikely to get the desired effect . HLA was negative , too. gabapentin is  still used for restless legs. 8 PM and 12.00 midnight.  eliminating her morning dose helped with daytime sleepiness. The change to night time dose helps her night time sleep. I see no other option for stimulant use in this patient with psychiatric overlay.   Interval history for 02/26/2016, Tina Wilcox is here today for follow-up visit. She continues to have some restless leg concerns but chiefly is concerned about insomnia and how these 2 conditions seem to interplay. At this time she feels actually a little bit more sleep tear and oversleeping easily. He can nap in daytime but she should avoid. She had some success with clonidine but it is not an significant improvement in  allowing her to go to sleep in a reasonable time. Her sleep latency may still be several hours in bed. The comorbidity of restless leg syndrome and borderline diabetes makes it harder for me to find a medication, gabapentin usually is safe considered for both conditions. I suggested we could also try Seroquel but I would need her behavior health provider to follow along with this medication given that diabetes may worsen under higher doses of Seroquel, usually not at 25 mg nightly.   02-04-2018, New problem - MIGRAINES. I have the pleasure of meeting Tina Wilcox today here in a revisit, she is a meanwhile 22 year old Caucasian female patient accompanied by her mother.  Tina Wilcox has a history of Asperger's syndrome, she has relates that circadian rhythm disturbance is that are frequently seen in the autism spectrum disorders.  However she is she is able to achieve 6 to 8 hours of sleep in a 24-hour.,  But often at rather all times.  This of course also can lead to additional social isolation, difficulties with functioning in her workplace etc. She has tried many hormone preparations including yazz, junel, and has a lot of migraine.  Migraine evaluation is her main reason today- she thinks it is an estrogen triggered migraine. The HA exacerbated after changes to a higher dose estrogen treatment on Balziva. Stiff neck, brain haze, hot and cold spells,  and concerns of break through bleeding/ spotting  and mood swings - psychiatric symptoms. She remains on Lamictal.    MEDICATIONS: Current Outpatient Medications on File Prior to Visit  Medication Sig Dispense Refill  . Biotin 10 MG CAPS Take by mouth.    . cetirizine (ZYRTEC) 10 MG tablet Take 10 mg by mouth daily.    . cloNIDine (CATAPRES) 0.1 MG tablet Take 1 tablet by mouth as needed.    . gabapentin (NEURONTIN) 600 MG tablet Take 600 mg by mouth 2 (two) times daily.     Marland Kitchen LAMICTAL 100 MG tablet Take 1.5 tablets by mouth daily.    . Melatonin 10 MG CAPS  Take 1 tablet by mouth at bedtime.    . norethindrone-ethinyl estradiol (BALZIVA) 0.4-35 MG-MCG tablet Take 1 tablet by mouth daily. 4 Package 0  . PREVIDENT 5000 BOOSTER PLUS 1.1 % PSTE APPLY THIN RIBBON OF PASTE ON TOOTHBRUSH NIGHTLY BEFORE BED DO NOT RINSE  6  . Vilazodone HCl (VIIBRYD) 40 MG TABS Take 40 mg by mouth daily.     No current facility-administered medications on file prior to visit.     ALLERGIES: Allergies  Allergen Reactions  . Corn-Containing Products Nausea And Vomiting  . Doxycycline     Multiple GI, Yeast, lymph node swelling, HA  . Peanut-Containing Drug Products Nausea Only  . Latex Rash    FAMILY HISTORY: Family History  Problem  Relation Age of Onset  . Hypertension Father   . Kidney Stones Mother   . Kidney Stones Sister   . Thyroid disease Maternal Grandmother   . Depression Maternal Grandmother   . Diabetes Maternal Grandfather   . CVA Maternal Grandfather   . Cancer Maternal Grandfather        unknown primary, ? melanoma  . Diabetes Paternal Grandmother   . Hypertension Paternal Grandmother   . Diabetes Paternal Grandfather   . Hypertension Paternal Grandfather   . Cancer Paternal Grandfather        leukemia    SOCIAL HISTORY: Social History   Socioeconomic History  . Marital status: Single    Spouse name: Not on file  . Number of children: 0  . Years of education: 63  . Highest education level: Not on file  Occupational History  . Occupation: n/a  Social Needs  . Financial resource strain: Not on file  . Food insecurity:    Worry: Not on file    Inability: Not on file  . Transportation needs:    Medical: Not on file    Non-medical: Not on file  Tobacco Use  . Smoking status: Never Smoker  . Smokeless tobacco: Never Used  Substance and Sexual Activity  . Alcohol use: No  . Drug use: No  . Sexual activity: Never    Birth control/protection: Pill    Comment: LoLoestrin  Lifestyle  . Physical activity:    Days per week: Not  on file    Minutes per session: Not on file  . Stress: Not on file  Relationships  . Social connections:    Talks on phone: Not on file    Gets together: Not on file    Attends religious service: Not on file    Active member of club or organization: Not on file    Attends meetings of clubs or organizations: Not on file    Relationship status: Not on file  . Intimate partner violence:    Fear of current or ex partner: Not on file    Emotionally abused: Not on file    Physically abused: Not on file    Forced sexual activity: Not on file  Other Topics Concern  . Not on file  Social History Narrative   Lives with mother and father    Caffeine use: rarely (lemonade powder mix)    Right handed, Caffeine barely, HS grad.  Looking for work, Lives at home.      REVIEW OF SYSTEMS:  14 system : negative except for weight gain and insomnia.   PHYSICAL EXAM: Vitals:   02/04/18 1413  BP: 132/87  Pulse: 76   General: No acute distress Head:  Normocephalic/atraumatic Neck: supple, no paraspinal tenderness, full range of motion,  No retrognathia ,   TMJ- wears a night guard. Bruxism marks .  Neck circumference is 15.' mallompatti 4  Back: No paraspinal tenderness. Heart: regular rate and rhythm Lungs: Clear to auscultation bilaterally. Vascular: No carotid bruits. Neurological Exam: Mental status: alert and oriented to person, place, and time, recent and remote memory intact, fund of knowledge intact, attention and concentration intact, speech fluent and not dysarthric, language intact. Cranial nerves:  There is esotropia  (mild ) of the left eye with conjugate gaze,  full extraocular movements in the horizontal and vertical planes without nystagmus,  pupillary response to light is preserved.   No facial asymmetry is noted, normal hearing, normal tongue and uvula midline movement.  Motor : equal tone and mass, no cog wheeling, normal strength.  Sensory intact to primary modalities.     Heel to shin/ finger to nose : no dysmetria Gait: normal station and stride.  Able to turn and walk in tandem.  Heel walk unsteady, but toe walk is normal, Romberg negative.  IMPRESSION:  Migraine - she did better on a very low estradial dose.  I would like her to return to those and accept the spotting or break through bleeding.  She felt psychologically stable on these products.   Primary insomnia related to the underlying ASPERGER, clonidine has helped sleep. Her insomnia was much more evident after she had a couple of days without clonidine, should stay on. She asked me to take this prescription over.   She has been treated with gabapentin for restless legs, temazepam and trazodone both have not helped with the insomnia but created side effects. She has not tried Seroquel, but she is allergic to corn products which this medication contains.  Klonopin is a benzodiazepine and I hesitate to use it.  She does not have parasomnias, she does not enact dreams. She is not moving during sleep. She does not have narcolepsy.   Larey Seat, MD     02-04-2018

## 2018-02-04 NOTE — Patient Instructions (Signed)
Clonidine extended release tablets (ADHD) What is this medicine? CLONIDINE (KLOE ni deen) is used to treat attention-deficit hyperactivity disorder (ADHD). This medicine may be used for other purposes; ask your health care provider or pharmacist if you have questions. COMMON BRAND NAME(S): Kapvay What should I tell my health care provider before I take this medicine? They need to know if you have any of these conditions: -bleeding in the brain -heart disease -high or low blood pressure -history of slow or irregular heartbeat -kidney disease -an unusual or allergic reaction to clonidine, other medicines, foods, dyes, or preservatives -pregnant or trying to get pregnant -breast-feeding How should I use this medicine? Take this medicine by mouth with a glass of water. Follow the directions on the prescription label. Do not cut, crush or chew this medicine. You can take it with or without food. If it upsets your stomach, take it with food. Take your doses at regular intervals. Do not take your medicine more often than directed. Do not suddenly stop taking this medicine. You must gradually reduce the dose. Ask your doctor or health care professional for advice. Talk to your pediatrician regarding the use of this medicine in children. While this drug may be prescribed for children as young as 6 years for selected conditions, precautions do apply. Overdosage: If you think you have taken too much of this medicine contact a poison control center or emergency room at once. NOTE: This medicine is only for you. Do not share this medicine with others. What if I miss a dose? If you miss a dose, skip the missed dose. Take the next dose at your regular time. Do not take double or extra doses. What may interact with this medicine? Do not take this medicine with any of the following medications: -MAOIs like Carbex, Eldepryl, Marplan, Nardil, and Parnate This medicine may also interact with the following  medications: -alcohol -certain medicines for seizures like phenobarbital -certain medicines for blood pressure, heart disease, irregular heart beat -certain medicines for depression, anxiety, or psychotic disturbances -medicines for sleep This list may not describe all possible interactions. Give your health care provider a list of all the medicines, herbs, non-prescription drugs, or dietary supplements you use. Also tell them if you smoke, drink alcohol, or use illegal drugs. Some items may interact with your medicine. What should I watch for while using this medicine? Visit your doctor or health care professional for regular checks on your progress. Check your heart rate and blood pressure regularly while you are taking this medicine. Ask your doctor or health care professional what your heart rate should be and when you should contact him or her. You may get drowsy or dizzy. Do not drive, use machinery, or do anything that needs mental alertness until you know how this medicine affects you. To avoid dizzy or fainting spells, do not stand or sit up quickly. Alcohol can make you more drowsy and dizzy. Avoid alcoholic drinks. Avoid becoming dehydrated or overheated. Do not stop taking except on your doctor's advice. You may develop a severe reaction. Your doctor will tell you how much medicine to take. Your mouth may get dry. Chewing sugarless gum or sucking hard candy, and drinking plenty of water will help. What side effects may I notice from receiving this medicine? Side effects that you should report to your doctor or health care professional as soon as possible: -allergic reactions like skin rash, itching or hives, swelling of the face, lips, or tongue -anxious or change in  mood -increased body temperature -low blood pressure -unusually slow heartbeat -unusually weak or tired Side effects that usually do not require medical attention (report to your doctor or health care professional if  they continue or are bothersome): -constipation -dizziness -dry mouth -headache -loss of appetite -nightmares or trouble sleeping -tiredness This list may not describe all possible side effects. Call your doctor for medical advice about side effects. You may report side effects to FDA at 1-800-FDA-1088. Where should I keep my medicine? Keep out of the reach of children. Store at room temperature between 20 and 25 degrees C (68 and 77 degrees F). Protect from light. Keep container tightly closed. Throw away any unused medicine after the expiration date. NOTE: This sheet is a summary. It may not cover all possible information. If you have questions about this medicine, talk to your doctor, pharmacist, or health care provider.  2018 Elsevier/Gold Standard (2015-05-24 10:04:59)

## 2018-02-17 ENCOUNTER — Ambulatory Visit (INDEPENDENT_AMBULATORY_CARE_PROVIDER_SITE_OTHER): Payer: 59 | Admitting: Obstetrics and Gynecology

## 2018-02-17 ENCOUNTER — Encounter: Payer: Self-pay | Admitting: Obstetrics and Gynecology

## 2018-02-17 ENCOUNTER — Other Ambulatory Visit: Payer: Self-pay

## 2018-02-17 VITALS — BP 122/70 | HR 60 | Resp 18 | Ht 65.0 in | Wt 224.0 lb

## 2018-02-17 DIAGNOSIS — Z01419 Encounter for gynecological examination (general) (routine) without abnormal findings: Secondary | ICD-10-CM

## 2018-02-17 MED ORDER — NORETHIN-ETH ESTRAD-FE BIPHAS 1 MG-10 MCG / 10 MCG PO TABS: 1.0000 | ORAL_TABLET | Freq: Every day | ORAL | 4 refills | Status: DC

## 2018-02-17 NOTE — Patient Instructions (Signed)

## 2018-02-17 NOTE — Progress Notes (Signed)
22 y.o. G0P0000 Single Caucasian female here for annual exam.    Saw her neurologist who recommended she return to low dose OCPs to reduce headaches and stabilize her moods better.  Taking her currently OCP continuously, and she wants to continue this with a LoLoestrin.  Patient complaining of bumps in vulvar area. Painful last night.   PCP: Sharilyn Sites, MD  No LMP recorded. (Menstrual status: Irregular Periods).     Period Cycle (Days): (takes OCPs continuously--has brown spotting once a month)     Sexually active: No.  The current method of family planning is OCP (estrogen/progesterone).    Exercising: Yes.    Treadmill Smoker:  no  Health Maintenance: Pap: 09-07-17 Neg:Neg HR HPV, 01-26-15 Neg History of abnormal Pap:  no MMG:  n/a Colonoscopy:  n/a BMD:   n/a  Result  n/a TDaP:  Unsure.  Thinks she is due in her mid20s.  Gardasil:   yes HIV: unsure Hep C:unsure Screening Labs:   ---   reports that she has never smoked. She has never used smokeless tobacco. She reports that she does not drink alcohol or use drugs.  Past Medical History:  Diagnosis Date  . 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 Asperger's syndrome   . Dermatitis   . Dysmenorrhea   . Headache   . Hyperlipidemia 2018  . Menorrhagia   . Restless legs syndrome (RLS)     Past Surgical History:  Procedure Laterality Date  . TONSILECTOMY/ADENOIDECTOMY WITH MYRINGOTOMY    . WISDOM TOOTH EXTRACTION  09/02/12    Current Outpatient Medications  Medication Sig Dispense Refill  . Biotin 10 MG CAPS Take by mouth.    . cetirizine (ZYRTEC) 10 MG tablet Take 10 mg by mouth daily.    . cloNIDine (CATAPRES) 0.1 MG tablet Take 1 tablet (0.1 mg total) by mouth as needed. 90 tablet 1  . gabapentin (NEURONTIN) 600 MG tablet Take 1 tablet (600 mg total) by mouth 2 (two) times daily. 60 tablet 5  . LAMICTAL 100 MG tablet Take 1.5 tablets by mouth daily.    . Melatonin 10 MG CAPS Take 1 tablet by mouth at  bedtime.    . norethindrone-ethinyl estradiol (BALZIVA) 0.4-35 MG-MCG tablet Take 1 tablet by mouth daily. 4 Package 0  . PREVIDENT 5000 BOOSTER PLUS 1.1 % PSTE APPLY THIN RIBBON OF PASTE ON TOOTHBRUSH NIGHTLY BEFORE BED DO NOT RINSE  6  . Vilazodone HCl (VIIBRYD) 40 MG TABS Take 40 mg by mouth daily.     No current facility-administered medications for this visit.     Family History  Problem Relation Age of Onset  . Hypertension Father   . Kidney Stones Mother   . Kidney Stones Sister   . Thyroid disease Maternal Grandmother   . Depression Maternal Grandmother   . Diabetes Maternal Grandfather   . CVA Maternal Grandfather   . Cancer Maternal Grandfather        unknown primary, ? melanoma  . Diabetes Paternal Grandmother   . Hypertension Paternal Grandmother   . Diabetes Paternal Grandfather   . Hypertension Paternal Grandfather   . Cancer Paternal Grandfather        leukemia    Review of Systems  All other systems reviewed and are negative.   Exam:   BP 122/70 (BP Location: Right Arm, Patient Position: Sitting, Cuff Size: Large)   Pulse 60   Resp 18   Ht 5\' 5"  (1.651 m)   Wt 224  lb (101.6 kg)   BMI 37.28 kg/m     General appearance: alert, cooperative and appears stated age Head: Normocephalic, without obvious abnormality, atraumatic Neck: no adenopathy, supple, symmetrical, trachea midline and thyroid normal to inspection and palpation Lungs: clear to auscultation bilaterally Breasts: normal appearance, no masses or tenderness, No nipple retraction or dimpling, No nipple discharge or bleeding, No axillary or supraclavicular adenopathy Heart: regular rate and rhythm Abdomen: soft, non-tender; no masses, no organomegaly Extremities: extremities normal, atraumatic, no cyanosis or edema Skin: Skin color, texture, turgor normal. No rashes or lesions Lymph nodes: Cervical, supraclavicular, and axillary nodes normal. No abnormal inguinal nodes palpated Neurologic: Grossly  normal  Pelvic: External genitalia:  no lesions              Urethra:  normal appearing urethra with no masses, tenderness or lesions              Bartholins and Skenes: normal                 Vagina: normal appearing vagina with normal color and discharge, no lesions              Cervix: no lesions              Pap taken: No. Bimanual Exam:  Uterus:  normal size, contour, position, consistency, mobility, non-tender              Adnexa: no mass, fullness, tenderness                Chaperone was present for exam.  Assessment:   Well woman visit with normal exam. Mood instability.  On COCs.  Intolerant of Yaz. Headaches.  Hyperlipidemia.   Plan: Mammogram screening. Recommended self breast awareness. Pap and HR HPV as above. Guidelines for Calcium, Vitamin D, regular exercise program including cardiovascular and weight bearing exercise. Switch to LoLoEstrin continuously.  Routine labs.  Follow up annually and prn.   After visit summary provided.

## 2018-02-18 LAB — CBC
Hematocrit: 39.9 % (ref 34.0–46.6)
Hemoglobin: 13.4 g/dL (ref 11.1–15.9)
MCH: 27.7 pg (ref 26.6–33.0)
MCHC: 33.6 g/dL (ref 31.5–35.7)
MCV: 82 fL (ref 79–97)
Platelets: 355 10*3/uL (ref 150–450)
RBC: 4.84 x10E6/uL (ref 3.77–5.28)
RDW: 13.3 % (ref 12.3–15.4)
WBC: 6.2 10*3/uL (ref 3.4–10.8)

## 2018-02-18 LAB — COMPREHENSIVE METABOLIC PANEL
ALK PHOS: 62 IU/L (ref 39–117)
ALT: 9 IU/L (ref 0–32)
AST: 12 IU/L (ref 0–40)
Albumin/Globulin Ratio: 1.8 (ref 1.2–2.2)
Albumin: 4.3 g/dL (ref 3.5–5.5)
BUN / CREAT RATIO: 9 (ref 9–23)
BUN: 6 mg/dL (ref 6–20)
Bilirubin Total: <0.2 mg/dL (ref 0.0–1.2)
CALCIUM: 9.9 mg/dL (ref 8.7–10.2)
CHLORIDE: 101 mmol/L (ref 96–106)
CO2: 23 mmol/L (ref 20–29)
CREATININE: 0.64 mg/dL (ref 0.57–1.00)
GFR, EST AFRICAN AMERICAN: 144 mL/min/{1.73_m2} (ref >59)
GFR, EST NON AFRICAN AMERICAN: 125 mL/min/{1.73_m2} (ref >59)
GLUCOSE: 81 mg/dL (ref 65–99)
Globulin, Total: 2.4 g/dL (ref 1.5–4.5)
Potassium: 4.5 mmol/L (ref 3.5–5.2)
Sodium: 141 mmol/L (ref 134–144)
Total Protein: 6.7 g/dL (ref 6.0–8.5)

## 2018-02-18 LAB — TSH: TSH: 2.09 u[IU]/mL (ref 0.450–4.500)

## 2018-02-18 LAB — VITAMIN D 25 HYDROXY (VIT D DEFICIENCY, FRACTURES): Vit D, 25-Hydroxy: 26.6 ng/mL — ABNORMAL LOW (ref 30.0–100.0)

## 2018-02-18 LAB — LIPID PANEL
CHOL/HDL RATIO: 5 ratio — AB (ref 0.0–4.4)
Cholesterol, Total: 214 mg/dL — ABNORMAL HIGH (ref 100–199)
HDL: 43 mg/dL (ref >39)
LDL CALC: 119 mg/dL — AB (ref 0–99)
TRIGLYCERIDES: 262 mg/dL — AB (ref 0–149)
VLDL Cholesterol Cal: 52 mg/dL — ABNORMAL HIGH (ref 5–40)

## 2018-02-23 ENCOUNTER — Telehealth: Payer: Self-pay | Admitting: *Deleted

## 2018-02-23 DIAGNOSIS — M530 Cervicocranial syndrome: Secondary | ICD-10-CM | POA: Diagnosis not present

## 2018-02-23 DIAGNOSIS — M9901 Segmental and somatic dysfunction of cervical region: Secondary | ICD-10-CM | POA: Diagnosis not present

## 2018-02-23 NOTE — Telephone Encounter (Signed)
-----   Message from Nunzio Cobbs, MD sent at 02/23/2018  8:13 AM EDT ----- Please report results of labs to patient.  Her vitamin D level is low, and I am recommending she start taking over the counter vitamin D 800 IU daily.  Her cholesterol and triglycerides are elevated, and I recommend she start a diet low in saturated fats and cholesterol.  I also recommend a vigorous exercise program.  She will need to follow up with her PCP regarding this.   Her blood counts, blood chemistries, and thyroid are all normal.

## 2018-02-23 NOTE — Telephone Encounter (Signed)
Notes recorded by Burnice Logan, RN on 02/23/2018 at 11:11 AM EDT Left message with patients mom to return call to [place], [place] at [facility] at [phone]. ------

## 2018-03-01 NOTE — Telephone Encounter (Signed)
Notes recorded by Burnice Logan, RN on 03/01/2018 at 2:22 PM EDT Left message to call Sharee Pimple, RN at [facility].

## 2018-03-08 NOTE — Telephone Encounter (Signed)
Left message on mobile number., ok per dpr,  requesting return call to Sharee Pimple, [place] at [facility] [phone] to review recent labs.

## 2018-03-16 DIAGNOSIS — M530 Cervicocranial syndrome: Secondary | ICD-10-CM | POA: Diagnosis not present

## 2018-03-16 DIAGNOSIS — M9901 Segmental and somatic dysfunction of cervical region: Secondary | ICD-10-CM | POA: Diagnosis not present

## 2018-03-17 NOTE — Telephone Encounter (Signed)
Notes recorded by Matthew Folks, RN on 03/16/2018 at 1:54 PM EST Patient returned call. Results reviewed with patient and she verbalized understanding. Will contact PCP for appointment. A copy of labs will be faxed to Dr. Cornelia Copa office.     Encounter closed.

## 2018-03-29 ENCOUNTER — Other Ambulatory Visit: Payer: Self-pay | Admitting: Obstetrics and Gynecology

## 2019-02-21 ENCOUNTER — Encounter: Payer: Self-pay | Admitting: Obstetrics and Gynecology

## 2019-02-21 ENCOUNTER — Other Ambulatory Visit: Payer: Self-pay

## 2019-02-21 ENCOUNTER — Ambulatory Visit: Payer: 59 | Admitting: Obstetrics and Gynecology

## 2019-02-21 ENCOUNTER — Ambulatory Visit: Payer: Medicaid Other | Admitting: Obstetrics and Gynecology

## 2019-02-21 VITALS — BP 116/64 | HR 80 | Temp 97.6°F | Resp 12 | Ht 64.75 in | Wt 227.0 lb

## 2019-02-21 DIAGNOSIS — Z0000 Encounter for general adult medical examination without abnormal findings: Secondary | ICD-10-CM

## 2019-02-21 DIAGNOSIS — R350 Frequency of micturition: Secondary | ICD-10-CM | POA: Diagnosis not present

## 2019-02-21 DIAGNOSIS — Z01419 Encounter for gynecological examination (general) (routine) without abnormal findings: Secondary | ICD-10-CM

## 2019-02-21 LAB — POCT URINALYSIS DIPSTICK
Bilirubin, UA: NEGATIVE
Glucose, UA: NEGATIVE
Ketones, UA: NEGATIVE
Nitrite, UA: NEGATIVE
Protein, UA: NEGATIVE
Urobilinogen, UA: 0.2 E.U./dL
pH, UA: 5 (ref 5.0–8.0)

## 2019-02-21 MED ORDER — LO LOESTRIN FE 1 MG-10 MCG / 10 MCG PO TABS: 1.0000 | ORAL_TABLET | Freq: Every day | ORAL | 4 refills | Status: DC

## 2019-02-21 NOTE — Progress Notes (Signed)
23 y.o. G59P0000 Single Caucasian female here for annual exam. Patient complains of having frequent urination from 10/12-10/15.   Her symptoms of frequency are gone now.  Denies dysuria.  Denies caffeine use.   On LoLoEstrin.  She is taking it continuously unless, she has breakthrough bleeding.  Then she just lets herself have a period.  She has a period every 2 - 3 months.  This pill is working well for her mood management.   She has elevated cholesterol and TG.  She has not seen her PCP about this.   Urine: trace RBC, trace WBC.  Just finishing her menses.   PCP:  Sharilyn Sites, MD   Patient's last menstrual period was 02/15/2019.           Sexually active: No.  The current method of family planning is OCP (estrogen/progesterone).    Exercising: Yes.    treadmill Smoker:  no  Health Maintenance: Pap:  09-07-17 Neg:Neg HR HPV.  01/26/15 negative.  History of abnormal Pap:  no TDaP:  unsure Gardasil:   yes HIV: never Hep C: never Screening Labs:  Discuss if needed   reports that she has never smoked. She has never used smokeless tobacco. She reports that she does not drink alcohol or use drugs.  Past Medical History:  Diagnosis Date  . 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 Asperger's syndrome   . Dermatitis   . Dysmenorrhea   . Headache   . Hyperlipidemia 2018  . Menorrhagia   . Restless legs syndrome (RLS)     Past Surgical History:  Procedure Laterality Date  . TONSILECTOMY/ADENOIDECTOMY WITH MYRINGOTOMY    . WISDOM TOOTH EXTRACTION  09/02/12    Current Outpatient Medications  Medication Sig Dispense Refill  . Biotin 10 MG CAPS Take by mouth.    . cetirizine (ZYRTEC) 10 MG tablet Take 10 mg by mouth daily.    . cloNIDine (CATAPRES) 0.1 MG tablet Take 1 tablet (0.1 mg total) by mouth as needed. 90 tablet 1  . gabapentin (NEURONTIN) 600 MG tablet Take 1 tablet (600 mg total) by mouth 2 (two) times daily. 60 tablet 5  . LAMICTAL 100 MG tablet Take 1.5  tablets by mouth daily.    . Melatonin 10 MG CAPS Take 1 tablet by mouth at bedtime.    . Norethindrone-Ethinyl Estradiol-Fe Biphas (LO LOESTRIN FE) 1 MG-10 MCG / 10 MCG tablet Take 1 tablet by mouth daily. 3 Package 4  . PREVIDENT 5000 BOOSTER PLUS 1.1 % PSTE APPLY THIN RIBBON OF PASTE ON TOOTHBRUSH NIGHTLY BEFORE BED DO NOT RINSE  6  . Vilazodone HCl (VIIBRYD) 40 MG TABS Take 40 mg by mouth daily.     No current facility-administered medications for this visit.     Family History  Problem Relation Age of Onset  . Hypertension Father   . Kidney Stones Mother   . Kidney Stones Sister   . Thyroid disease Maternal Grandmother   . Depression Maternal Grandmother   . Diabetes Maternal Grandfather   . CVA Maternal Grandfather   . Cancer Maternal Grandfather        unknown primary, ? melanoma  . Diabetes Paternal Grandmother   . Hypertension Paternal Grandmother   . Diabetes Paternal Grandfather   . Hypertension Paternal Grandfather   . Cancer Paternal Grandfather        leukemia    Review of Systems  Constitutional: Negative.   HENT: Negative.   Eyes: Negative.   Respiratory:  Negative.   Cardiovascular: Negative.   Gastrointestinal: Negative.   Endocrine: Negative.   Genitourinary: Positive for frequency.  Musculoskeletal: Negative.   Skin: Negative.   Allergic/Immunologic: Negative.   Neurological: Negative.   Hematological: Negative.   Psychiatric/Behavioral: Negative.     Exam:   BP 116/64 (BP Location: Left Arm, Patient Position: Sitting, Cuff Size: Large)   Pulse 80   Temp 97.6 F (36.4 C) (Temporal)   Resp 12   Ht 5' 4.75" (1.645 m)   Wt 227 lb (103 kg)   LMP 02/15/2019   BMI 38.07 kg/m     General appearance: alert, cooperative and appears stated age Head: normocephalic, without obvious abnormality, atraumatic Neck: no adenopathy, supple, symmetrical, trachea midline and thyroid normal to inspection and palpation Lungs: clear to auscultation  bilaterally Breasts: normal appearance, no masses or tenderness, No nipple retraction or dimpling, No nipple discharge or bleeding, No axillary adenopathy Heart: regular rate and rhythm Abdomen: soft, non-tender; no masses, no organomegaly Extremities: extremities normal, atraumatic, no cyanosis or edema Skin: skin color, texture, turgor normal. No rashes or lesions Lymph nodes: cervical, supraclavicular, and axillary nodes normal. Neurologic: grossly normal  Pelvic: External genitalia:  no lesions              No abnormal inguinal nodes palpated.              Urethra:  normal appearing urethra with no masses, tenderness or lesions              Bartholins and Skenes: normal                 Vagina: normal appearing vagina with normal color and discharge, no lesions.                Cervix: no lesions.  Small amount of light brown discharge.              Pap taken: No. Bimanual Exam:  Uterus:  normal size, contour, position, consistency, mobility, non-tender              Adnexa: no mass, fullness, tenderness              Chaperone was present for exam.  Assessment:   Well woman visit with normal exam. Mood instability.  On COCs.  Intolerant of Yaz. Headaches. Hyperlipidemia.  Long standing elevated TG.  I suspect this may be genetic.  Low vit D.  Urinary frequency.   Plan: Mammogram screening discussed. Self breast awareness reviewed. Pap and HR HPV as above. Guidelines for Calcium, Vitamin D, regular exercise program including cardiovascular and weight bearing exercise. Flu vaccine recommended.  Urine micro and culture.  We talked at length about her elevated triglycerides.  I recommend she follow up with her PCP regarding this.  She will do a vit D recheck with her PCP as well. Follow up annually and prn.   After visit summary provided.

## 2019-02-21 NOTE — Patient Instructions (Signed)

## 2019-02-22 LAB — URINALYSIS, MICROSCOPIC ONLY
Bacteria, UA: NONE SEEN
Casts: NONE SEEN /lpf

## 2019-02-23 LAB — URINE CULTURE

## 2020-02-22 NOTE — Progress Notes (Signed)
24 y.o. G0P0000 Single Caucasian female here for annual exam.    Cycle history: 11-30-19 normal cycle 12-21-19 to 12-26-19 normal Nothing x 2 days 12-28-19 to 01-01-20 bled again. No menses in September. Menses in October was long and mucousy. Taking continuous birth control. Does miss some pills.   Does have some PMS symptoms.   Wants to test her thyroid.   States she is able to tolerate corn.   Migraines seem to be getting worse--no aura. Migraines started getting worse in August.  Having memory changes also.   Received her Covid vaccine.  Has not received her flu vaccine.   PCP: Sharilyn Sites, MD    Patient's last menstrual period was 02/12/2020 (exact date).     Period Cycle (Days):  (on continuous OCPs) Period Pattern: (!) Irregular     Sexually active: No.  The current method of family planning is OCP (estrogen/progesterone)--LoLoestrin continuously.    Exercising: Yes.    treadmill, cycling and stretches Smoker:  no  Health Maintenance: Pap: 09-07-17 Neg:Neg HR HPV.  01/26/15 negative. History of abnormal Pap:  no MMG:  n/a Colonoscopy:  n/a BMD:   n/a  Result  n/a TDaP:  Unsure--with ?PCP Gardasil:   Yes, completed VVO:HYWVP Hep C:never Screening Labs:  PCP.   reports that she has never smoked. She has never used smokeless tobacco. She reports that she does not drink alcohol and does not use drugs.  Past Medical History:  Diagnosis Date  . 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 Asperger's syndrome   . Dermatitis   . Dysmenorrhea   . Headache   . Hyperlipidemia 2018  . Menorrhagia   . Restless legs syndrome (RLS)     Past Surgical History:  Procedure Laterality Date  . TONSILECTOMY/ADENOIDECTOMY WITH MYRINGOTOMY    . WISDOM TOOTH EXTRACTION  09/02/12    Current Outpatient Medications  Medication Sig Dispense Refill  . Biotin 10 MG CAPS Take by mouth.    . cetirizine (ZYRTEC) 10 MG tablet Take 10 mg by mouth daily.    . cloNIDine (CATAPRES)  0.1 MG tablet Take 1 tablet (0.1 mg total) by mouth as needed. 90 tablet 1  . gabapentin (NEURONTIN) 600 MG tablet Take 1 tablet (600 mg total) by mouth 2 (two) times daily. 60 tablet 5  . LAMICTAL 100 MG tablet Take 1.5 tablets by mouth daily.    . Melatonin 10 MG CAPS Take 1 tablet by mouth at bedtime.    . Multiple Vitamins-Minerals (MULTIVITAMIN GUMMIES ADULT PO) Take 1 tablet by mouth daily.    . Norethindrone-Ethinyl Estradiol-Fe Biphas (LO LOESTRIN FE) 1 MG-10 MCG / 10 MCG tablet Take 1 tablet by mouth daily. 3 Package 4  . PREVIDENT 5000 BOOSTER PLUS 1.1 % PSTE APPLY THIN RIBBON OF PASTE ON TOOTHBRUSH NIGHTLY BEFORE BED DO NOT RINSE  6  . Vilazodone HCl (VIIBRYD) 40 MG TABS Take 40 mg by mouth daily.     No current facility-administered medications for this visit.    Family History  Problem Relation Age of Onset  . Hypertension Father   . Kidney Stones Mother   . Kidney Stones Sister   . Thyroid disease Maternal Grandmother   . Depression Maternal Grandmother   . Diabetes Maternal Grandfather   . CVA Maternal Grandfather   . Cancer Maternal Grandfather        unknown primary, ? melanoma  . Diabetes Paternal Grandmother   . Hypertension Paternal Grandmother   . Diabetes Paternal  Grandfather   . Hypertension Paternal Grandfather   . Cancer Paternal Grandfather        leukemia    Review of Systems  Neurological: Positive for headaches.  All other systems reviewed and are negative.   Exam:   BP 120/70 (Cuff Size: Large)   Pulse 70   Ht 5\' 6"  (1.676 m)   Wt 230 lb (104.3 kg)   LMP 02/12/2020 (Exact Date)   SpO2 98%   BMI 37.12 kg/m     General appearance: alert, cooperative and appears stated age Head: normocephalic, without obvious abnormality, atraumatic Neck: no adenopathy, supple, symmetrical, trachea midline and thyroid normal to inspection and palpation Lungs: clear to auscultation bilaterally Breasts: normal appearance, no masses or tenderness, No nipple  retraction or dimpling, No nipple discharge or bleeding, No axillary adenopathy Heart: regular rate and rhythm Abdomen: soft, non-tender; no masses, no organomegaly Extremities: extremities normal, atraumatic, no cyanosis or edema Skin: skin color, texture, turgor normal. No rashes or lesions Lymph nodes: cervical, supraclavicular, and axillary nodes normal. Neurologic: grossly normal  Pelvic: External genitalia:  no lesions              No abnormal inguinal nodes palpated.              Urethra:  normal appearing urethra with no masses, tenderness or lesions              Bartholins and Skenes: normal                 Vagina: normal appearing vagina with normal color and discharge, no lesions              Cervix: no lesions              Pap taken: No. Bimanual Exam:  Uterus:  normal size, contour, position, consistency, mobility, non-tender              Adnexa: no mass, fullness, tenderness              Chaperone was present for exam.  Assessment:   Well woman visit with normal exam. Mood instability.  On LoLoestrin . Intolerant of Yaz. Increasing migraines. Hyperlipidemia. Long standing elevated TG.   Low vit D.  Urinary frequency.   Plan: Mammogram screening discussed. Self breast awareness reviewed. Pap and HR HPV as above. Guidelines for Calcium, Vitamin D, regular exercise program including cardiovascular and weight bearing exercise. Switch to Micronor. Instructed in use.  Check TSH today. Follow up annually and prn.   After visit summary provided.

## 2020-02-23 ENCOUNTER — Ambulatory Visit: Payer: Medicaid Other | Admitting: Obstetrics and Gynecology

## 2020-02-23 ENCOUNTER — Other Ambulatory Visit: Payer: Self-pay

## 2020-02-23 ENCOUNTER — Encounter: Payer: Self-pay | Admitting: Obstetrics and Gynecology

## 2020-02-23 VITALS — BP 120/70 | HR 70 | Ht 66.0 in | Wt 230.0 lb

## 2020-02-23 DIAGNOSIS — Z01419 Encounter for gynecological examination (general) (routine) without abnormal findings: Secondary | ICD-10-CM

## 2020-02-23 DIAGNOSIS — Z0000 Encounter for general adult medical examination without abnormal findings: Secondary | ICD-10-CM

## 2020-02-23 MED ORDER — NORETHINDRONE 0.35 MG PO TABS: 1.0000 | ORAL_TABLET | Freq: Every day | ORAL | 3 refills | Status: DC

## 2020-02-23 NOTE — Patient Instructions (Signed)

## 2020-02-28 ENCOUNTER — Telehealth: Payer: Self-pay

## 2020-02-28 DIAGNOSIS — Z0000 Encounter for general adult medical examination without abnormal findings: Secondary | ICD-10-CM

## 2020-02-28 DIAGNOSIS — Z01419 Encounter for gynecological examination (general) (routine) without abnormal findings: Secondary | ICD-10-CM

## 2020-02-28 LAB — TSH

## 2020-02-28 NOTE — Telephone Encounter (Signed)
Left message to call Bandana at [phone].  Need to advise patient that [facility] was unable to process labs from 10/21 and discuss recollection.

## 2020-02-29 NOTE — Telephone Encounter (Signed)
Patient is returning call.  °

## 2020-03-01 NOTE — Telephone Encounter (Signed)
Patient is returning your call.  

## 2020-03-01 NOTE — Telephone Encounter (Signed)
Left message to call Quenton Recendez at [phone]. 

## 2020-03-01 NOTE — Telephone Encounter (Signed)
Spoke with patient. Advised that [facility] was unable to process labs from 02/23/20. Lab appointment scheduled for 03/07/2020 at 1:15 pm. Patient is agreeable to date and time.  Routing to provider and will close encounter.

## 2020-03-07 ENCOUNTER — Other Ambulatory Visit: Payer: Medicaid Other

## 2020-03-07 ENCOUNTER — Other Ambulatory Visit: Payer: Self-pay

## 2020-03-07 DIAGNOSIS — Z0000 Encounter for general adult medical examination without abnormal findings: Secondary | ICD-10-CM

## 2020-03-07 DIAGNOSIS — Z01419 Encounter for gynecological examination (general) (routine) without abnormal findings: Secondary | ICD-10-CM

## 2020-03-08 LAB — TSH: TSH: 1.77 u[IU]/mL (ref 0.450–4.500)

## 2020-03-16 ENCOUNTER — Encounter: Payer: Self-pay | Admitting: Obstetrics and Gynecology

## 2020-03-20 ENCOUNTER — Telehealth: Payer: Self-pay

## 2020-03-20 NOTE — Telephone Encounter (Signed)
Pt sent the following mychart message:  [place], [facility] I've been on my new birth control Norethindrone for 18 days now, and I wanted to know if it's common to get severe PMS-like symptoms when adapting to the hormonal changes from new medication.  My migraines are slightly better (less frequent and less severe), but for the past week I've had abnormally bad mood swings, anxiety, and depression, yet none of the other typical signs that I'm starting a period.  I'm trying to gauge if I should revert to LoLoestrin or if I should give the Norethindrone another month or two to see if things smooth out. I'm leaning towards the latter, but I wanted to hear your opinion on it.

## 2020-03-20 NOTE — Telephone Encounter (Signed)
Routing to Dr Quincy Simmonds, please advise

## 2020-03-21 NOTE — Telephone Encounter (Signed)
Her PMS symptoms/emotional symptoms may be from switching her birth control pill.  If she wants to continue with the progesterone only pill, Ok.  She may need to try it a little longer to see if her mood stabilizes.  She was worried about her headaches and really wanted to try estrogen free birth control.

## 2020-03-21 NOTE — Telephone Encounter (Signed)
Call to patient. Message given to patient as seen below from Dr. Quincy Simmonds and patient verbalized understanding. Patient states she will give the POP a little longer to see if her mood stabilizes.   Encounter closed.

## 2020-12-27 ENCOUNTER — Other Ambulatory Visit: Payer: Self-pay | Admitting: Obstetrics and Gynecology

## 2020-12-27 NOTE — Telephone Encounter (Signed)
Annaul exam due scheduled on 02/27/21

## 2021-02-26 NOTE — Progress Notes (Signed)
25 y.o. G0P0000 Single Caucasian female here for annual exam.    Stopped LoLoestrin back in October, 2021 due to her migraines.  She did well on POPs for 6 months after this. Then she has had an increase in her migraines again.  Has a red spot of blood on an occasion. No real full cycle since last October.   PCP:  Kip  Corrington, PA  No LMP recorded. (Menstrual status: Oral contraceptives).     Period Cycle (Days):  (on continuous OCPs)     Sexually active: No.  The current method of family planning is POPs--continuously.    Exercising: Yes.    Stretching, treadmill Smoker:  no  Health Maintenance: Pap: 09-07-17 Neg:Neg HR HPV.  01/26/15 Neg History of abnormal Pap:  no MMG:  n/a Colonoscopy:  n/a BMD:   n/a  Result  n/a TDaP:  ?with PCP Gardasil:   yes HIV: never Hep C: never Screening Labs:  PCP. Doing a Covid vaccine tomorrow.    reports that she has never smoked. She has never used smokeless tobacco. She reports that she does not drink alcohol and does not use drugs.  Past Medical History:  Diagnosis Date   Arthritis 2018   --post traumatic-left leg   Asperger's syndrome    Dermatitis    Dysmenorrhea    Headache    Hyperlipidemia 2018   Menorrhagia    Restless legs syndrome (RLS)     Past Surgical History:  Procedure Laterality Date   TONSILECTOMY/ADENOIDECTOMY WITH MYRINGOTOMY     WISDOM TOOTH EXTRACTION  09/02/12    Current Outpatient Medications  Medication Sig Dispense Refill   cetirizine (ZYRTEC) 10 MG tablet Take 10 mg by mouth daily.     cloNIDine (CATAPRES) 0.1 MG tablet Take 1 tablet (0.1 mg total) by mouth as needed. 90 tablet 1   gabapentin (NEURONTIN) 600 MG tablet Take 1 tablet (600 mg total) by mouth 2 (two) times daily. 60 tablet 5   LAMICTAL 100 MG tablet Take 1.5 tablets by mouth daily.     Melatonin 10 MG CAPS Take 1 tablet by mouth at bedtime.     Multiple Vitamins-Minerals (MULTIVITAMIN GUMMIES ADULT PO) Take 1 tablet by mouth daily.      norethindrone (MICRONOR) 0.35 MG tablet TAKE 1 TABLET BY MOUTH EVERY DAY 84 tablet 0   PREVIDENT 5000 BOOSTER PLUS 1.1 % PSTE APPLY THIN RIBBON OF PASTE ON TOOTHBRUSH NIGHTLY BEFORE BED DO NOT RINSE  6   Vilazodone HCl (VIIBRYD) 40 MG TABS Take by mouth.     No current facility-administered medications for this visit.    Family History  Problem Relation Age of Onset   Hypertension Father    Kidney Stones Mother    Kidney Stones Sister    Thyroid disease Maternal Grandmother    Depression Maternal Grandmother    Diabetes Maternal Grandfather    CVA Maternal Grandfather    Cancer Maternal Grandfather        unknown primary, ? melanoma   Diabetes Paternal Grandmother    Hypertension Paternal Grandmother    Diabetes Paternal Grandfather    Hypertension Paternal Grandfather    Cancer Paternal Grandfather        leukemia    Review of Systems  Musculoskeletal:  Positive for myalgias.  Neurological:  Positive for headaches.  All other systems reviewed and are negative.  Exam:   BP 122/74   Pulse 82   Ht 5' 4.5" (1.638 m)   Wt 223  lb (101.2 kg)   SpO2 97%   BMI 37.69 kg/m     General appearance: alert, cooperative and appears stated age Head: normocephalic, without obvious abnormality, atraumatic Neck: no adenopathy, supple, symmetrical, trachea midline and thyroid normal to inspection and palpation Lungs: clear to auscultation bilaterally Breasts: normal appearance, no masses or tenderness, No nipple retraction or dimpling, No nipple discharge or bleeding, No axillary adenopathy Heart: regular rate and rhythm Abdomen: soft, non-tender; no masses, no organomegaly Extremities: extremities normal, atraumatic, no cyanosis or edema Skin: skin color, texture, turgor normal. No rashes or lesions Lymph nodes: cervical, supraclavicular, and axillary nodes normal. Neurologic: grossly normal  Pelvic: External genitalia:  no lesions              No abnormal inguinal nodes  palpated.              Urethra:  normal appearing urethra with no masses, tenderness or lesions              Bartholins and Skenes: normal                 Vagina: normal appearing vagina with normal color and discharge, no lesions              Cervix: no lesions.  Friable.               Pap taken: Yes.   Bimanual Exam:  Uterus:  normal size, contour, position, consistency, mobility, non-tender              Adnexa: no mass, fullness, tenderness          Chaperone was present for exam:  Estill Bamberg, CMA  Assessment:   Well woman visit with gynecologic exam. Mood instability.  On POPs. Intolerant of Yaz. Increasing migraines. Hyperlipidemia.  Long standing elevated TG.   Hx low vit D.   Plan: Mammogram screening age 76. Self breast awareness reviewed. Pap and reflex HR HPV.  Guidelines for Calcium, Vitamin D, regular exercise program including cardiovascular and weight bearing exercise. Switch to Slynd progesterone birth control.  Side effects and risk of thromboembolic events discussed.  Fu in 3 months for recheck and check of K level. Follow up annually and prn.   After visit summary provided.

## 2021-02-27 ENCOUNTER — Other Ambulatory Visit (HOSPITAL_COMMUNITY)
Admission: RE | Admit: 2021-02-27 | Discharge: 2021-02-27 | Disposition: A | Discharge status: Home / Self Care | Payer: Medicaid Other | Source: Ambulatory Visit | Attending: Obstetrics and Gynecology | Admitting: Obstetrics and Gynecology

## 2021-02-27 ENCOUNTER — Other Ambulatory Visit: Payer: Self-pay

## 2021-02-27 ENCOUNTER — Ambulatory Visit (INDEPENDENT_AMBULATORY_CARE_PROVIDER_SITE_OTHER): Payer: Medicaid Other | Admitting: Obstetrics and Gynecology

## 2021-02-27 ENCOUNTER — Encounter: Payer: Self-pay | Admitting: Obstetrics and Gynecology

## 2021-02-27 VITALS — BP 122/74 | HR 82 | Ht 64.5 in | Wt 223.0 lb

## 2021-02-27 DIAGNOSIS — Z124 Encounter for screening for malignant neoplasm of cervix: Secondary | ICD-10-CM | POA: Insufficient documentation

## 2021-02-27 DIAGNOSIS — Z01419 Encounter for gynecological examination (general) (routine) without abnormal findings: Secondary | ICD-10-CM | POA: Diagnosis not present

## 2021-02-27 DIAGNOSIS — Z0000 Encounter for general adult medical examination without abnormal findings: Secondary | ICD-10-CM

## 2021-02-27 MED ORDER — SLYND 4 MG PO TABS: 4.0000 mg | ORAL_TABLET | Freq: Every day | ORAL | 11 refills | Status: DC

## 2021-02-27 NOTE — Patient Instructions (Signed)

## 2021-03-01 LAB — CYTOLOGY - PAP: Diagnosis: NEGATIVE

## 2021-03-24 ENCOUNTER — Encounter: Payer: Self-pay | Admitting: Obstetrics and Gynecology

## 2021-03-25 NOTE — Telephone Encounter (Signed)
Ok to Stop Pigeon and restart the Ortho Micronor.  [place] for refills until next annual exam.   Please have Reva reach out to the provider monitoring migraines for any additional recommendations.

## 2021-03-26 MED ORDER — NORETHINDRONE 0.35 MG PO TABS: 1.0000 | ORAL_TABLET | Freq: Every day | ORAL | 3 refills | Status: DC

## 2021-03-26 NOTE — Telephone Encounter (Signed)
January follow up appointment may be cancelled.

## 2021-05-30 ENCOUNTER — Ambulatory Visit: Payer: Medicaid Other | Admitting: Obstetrics and Gynecology

## 2022-03-03 ENCOUNTER — Ambulatory Visit (INDEPENDENT_AMBULATORY_CARE_PROVIDER_SITE_OTHER): Payer: Medicaid Other | Admitting: Obstetrics and Gynecology

## 2022-03-03 ENCOUNTER — Encounter: Payer: Self-pay | Admitting: Obstetrics and Gynecology

## 2022-03-03 VITALS — BP 118/80 | Ht 64.0 in | Wt 233.0 lb

## 2022-03-03 DIAGNOSIS — Z01419 Encounter for gynecological examination (general) (routine) without abnormal findings: Secondary | ICD-10-CM

## 2022-03-03 MED ORDER — NORETHINDRONE 0.35 MG PO TABS: 1.0000 | ORAL_TABLET | Freq: Every day | ORAL | 3 refills | Status: DC

## 2022-03-03 NOTE — Patient Instructions (Signed)

## 2022-03-03 NOTE — Progress Notes (Signed)
26 y.o. G0P0000 Single Caucasian female here for annual exam, discuss vaginal lump   No periods with her current pill.  Doing ok on Micronor and wants to continue this.   Will start therapy for trauma.   Has been treated for possible walking pneumonia.  Took Amoxicillin and this resolved her symptoms.  Still considering hormonal treatment.  States Dr. Brigitte Pulse is not taking new patients.  PCP:   Pablo Lawrence, NP  Patient's last menstrual period was 06/19/2021.     Period Pattern: (!) Irregular Menstrual Flow: Moderate Menstrual Control: Maxi pad, Panty liner Dysmenorrhea: None     Sexually active: No.  The current method of family planning is Micronor  Exercising: yes  Home exercise routine includes treadmill. Smoker:  no  Health Maintenance: Pap:  02/27/2021 neg History of abnormal Pap:  no Colonoscopy:  n/a BMD:   n/a  Result  n/a TDaP:  unsure Gardasil:   completed Screening Labs:  PCP Flu vaccine:  declined.    reports that she has never smoked. She has never used smokeless tobacco. She reports that she does not drink alcohol and does not use drugs.  Past Medical History:  Diagnosis Date   Arthritis 2018   --post traumatic-left leg   Asperger's syndrome    Dermatitis    Dysmenorrhea    Headache    Hyperlipidemia 2018   Menorrhagia    Restless legs syndrome (RLS)     Past Surgical History:  Procedure Laterality Date   TONSILECTOMY/ADENOIDECTOMY WITH MYRINGOTOMY     WISDOM TOOTH EXTRACTION  09/02/12    Current Outpatient Medications  Medication Sig Dispense Refill   cetirizine (ZYRTEC) 10 MG tablet Take 10 mg by mouth daily.     cloNIDine (CATAPRES) 0.1 MG tablet Take 1 tablet (0.1 mg total) by mouth as needed. 90 tablet 1   gabapentin (NEURONTIN) 600 MG tablet Take 1 tablet (600 mg total) by mouth 2 (two) times daily. 60 tablet 5   LAMICTAL 100 MG tablet Take 1.5 tablets by mouth daily.     Melatonin 10 MG CAPS Take 1 tablet by mouth at bedtime.      Multiple Vitamins-Minerals (MULTIVITAMIN GUMMIES ADULT PO) Take 1 tablet by mouth daily.     PREVIDENT 5000 BOOSTER PLUS 1.1 % PSTE APPLY THIN RIBBON OF PASTE ON TOOTHBRUSH NIGHTLY BEFORE BED DO NOT RINSE  6   Vilazodone HCl (VIIBRYD) 40 MG TABS Take by mouth.     norethindrone (MICRONOR) 0.35 MG tablet Take 1 tablet (0.35 mg total) by mouth daily. 84 tablet 3   No current facility-administered medications for this visit.    Family History  Problem Relation Age of Onset   Hypertension Father    Kidney Stones Mother    Kidney Stones Sister    Thyroid disease Maternal Grandmother    Depression Maternal Grandmother    Diabetes Maternal Grandfather    CVA Maternal Grandfather    Cancer Maternal Grandfather        unknown primary, ? melanoma   Diabetes Paternal Grandmother    Hypertension Paternal Grandmother    Diabetes Paternal Grandfather    Hypertension Paternal Grandfather    Cancer Paternal Grandfather        leukemia    Review of Systems  Genitourinary:        Vaginal lump    Exam:   BP 118/80 (BP Location: Right Arm, Patient Position: Sitting, Cuff Size: Normal)   Ht '5\' 4"'$  (1.626 m)   Wt 233  lb (105.7 kg)   LMP 06/19/2021   BMI 39.99 kg/m     General appearance: alert, cooperative and appears stated age Head: normocephalic, without obvious abnormality, atraumatic Neck: no adenopathy, supple, symmetrical, trachea midline and thyroid normal to inspection and palpation Lungs: clear to auscultation bilaterally Breasts: normal appearance, no masses or tenderness, No nipple retraction or dimpling, No nipple discharge or bleeding, No axillary adenopathy Heart: regular rate and rhythm Abdomen: soft, non-tender; no masses, no organomegaly Extremities: extremities normal, atraumatic, no cyanosis or edema Skin: skin color, texture, turgor normal. No rashes or lesions Lymph nodes: cervical, supraclavicular, and axillary nodes normal. Neurologic: grossly normal  Pelvic:  External genitalia:  no lesions              No abnormal inguinal nodes palpated.              Urethra:  normal appearing urethra with no masses, tenderness or lesions              Bartholins and Skenes: normal                 Vagina: normal appearing vagina with normal color and discharge, no lesions              Cervix: no lesions              Pap taken: no Bimanual Exam:  Uterus:  normal size, contour, position, consistency, mobility, non-tender              Adnexa: no mass, fullness, tenderness                Chaperone was present for exam:  Kimalexis, CMA.  Assessment:   Well woman visit with gynecologic exam. Mood instability.  On POPs. Intolerant of Yaz. Migraines. Hyperlipidemia.  Long standing elevated TG.   Hx low vit D.   Plan: Mammogram screening discussed. Self breast awareness reviewed. Pap in 2025.  Guidelines for Calcium, Vitamin D, regular exercise program including cardiovascular and weight bearing exercise. Refill of POPs for one year.  Labs with PCP.  We discussed options for locations for her to receive hormonal treatment.  Follow up annually and prn.   After visit summary provided.

## 2023-02-23 NOTE — Progress Notes (Signed)
27 y.o. G0P0000 Single Caucasian female here for annual exam.    Occasional period. Her last period was June or July.   Had a second one occurred 10 days later.  Will stop the pills for about 4 days if the bleeding is irregular. Not sexually active.   Has more inflammation when she allergies are increased in the fall.   Occasional bruising.  AST level is 40 on chart review.  PCP following this.   Working on her art.   PCP: Roe Rutherford, NP   No LMP recorded. (Menstrual status: Oral contraceptives).           Sexually active: No.  The current method of family planning is POPs  Exercising: Yes.     Treadmill/biking 30 min Smoker:  no  OB History  Gravida Para Term Preterm AB Living  0 0 0 0 0 0  SAB IAB Ectopic Multiple Live Births  0 0 0 0 0     Health Maintenance: Pap:  02/27/2021 neg  History of abnormal Pap:  no MMG: n/a Colonoscopy:   HM Colonoscopy     This patient has no relevant Health Maintenance data.      BMD:  n/a  Result  n/a  HIV: n/a Hep C: n/a  Immunization History  Administered Date(s) Administered   HPV 9-valent 02/05/2017, 04/09/2017, 08/10/2017   PFIZER Comirnaty(Gray Top)Covid-19 Tri-Sucrose Vaccine 09/05/2020   PFIZER(Purple Top)SARS-COV-2 Vaccination 07/25/2019, 08/15/2019, 04/04/2020   Pfizer Covid-19 Vaccine Bivalent Booster 80yrs & up 02/28/2021   Pfizer(Comirnaty)Fall Seasonal Vaccine 12 years and older 03/05/2022   Flu and Covid vaccine discussed.     reports that she has never smoked. She has never used smokeless tobacco. She reports that she does not drink alcohol and does not use drugs.  Past Medical History:  Diagnosis Date   Arthritis 2018   --post traumatic-left leg   Asperger's syndrome    Dermatitis    Dysmenorrhea    Headache    Hyperlipidemia 2018   Menorrhagia    Restless legs syndrome (RLS)     Past Surgical History:  Procedure Laterality Date   TONSILECTOMY/ADENOIDECTOMY WITH MYRINGOTOMY      WISDOM TOOTH EXTRACTION  09/02/12    Current Outpatient Medications  Medication Sig Dispense Refill   cetirizine (ZYRTEC) 10 MG tablet Take 10 mg by mouth daily.     cloNIDine (CATAPRES) 0.1 MG tablet Take 1 tablet (0.1 mg total) by mouth as needed. 90 tablet 1   gabapentin (NEURONTIN) 600 MG tablet Take 1 tablet (600 mg total) by mouth 2 (two) times daily. 60 tablet 5   LAMICTAL 100 MG tablet Take 1.5 tablets by mouth daily.     Melatonin 10 MG CAPS Take 1 tablet by mouth at bedtime.     Multiple Vitamins-Minerals (MULTIVITAMIN GUMMIES ADULT PO) Take 1 tablet by mouth daily.     norethindrone (MICRONOR) 0.35 MG tablet Take 1 tablet (0.35 mg total) by mouth daily. 84 tablet 3   Vilazodone HCl (VIIBRYD) 40 MG TABS Take by mouth.     PREVIDENT 5000 BOOSTER PLUS 1.1 % PSTE APPLY THIN RIBBON OF PASTE ON TOOTHBRUSH NIGHTLY BEFORE BED DO NOT RINSE (Patient not taking: Reported on 03/09/2023)  6   No current facility-administered medications for this visit.    Family History  Problem Relation Age of Onset   Hypertension Father    Kidney Stones Mother    Kidney Stones Sister    Thyroid disease Maternal Grandmother  Depression Maternal Grandmother    Diabetes Maternal Grandfather    CVA Maternal Grandfather    Cancer Maternal Grandfather        unknown primary, ? melanoma   Diabetes Paternal Grandmother    Hypertension Paternal Grandmother    Diabetes Paternal Grandfather    Hypertension Paternal Grandfather    Cancer Paternal Grandfather        leukemia    Review of Systems  All other systems reviewed and are negative.   Exam:   BP 126/80 (BP Location: Left Arm, Patient Position: Sitting, Cuff Size: Normal)   Pulse 75   Ht 5' 4.5" (1.638 m)   Wt 227 lb (103 kg)   SpO2 97%   BMI 38.36 kg/m     General appearance: alert, cooperative and appears stated age Head: normocephalic, without obvious abnormality, atraumatic Neck: no adenopathy, supple, symmetrical, trachea midline and  thyroid normal to inspection and palpation Lungs: clear to auscultation bilaterally Breasts: normal appearance, no masses or tenderness, No nipple retraction or dimpling, No nipple discharge or bleeding, No axillary adenopathy Heart: regular rate and rhythm Abdomen: soft, non-tender; no masses, no organomegaly Extremities: extremities normal, atraumatic, no cyanosis or edema Skin: skin color, texture, turgor normal. No rashes or lesions Lymph nodes: cervical, supraclavicular, and axillary nodes normal. Neurologic: grossly normal  Pelvic: External genitalia:  no lesions              No abnormal inguinal nodes palpated.              Urethra:  normal appearing urethra with no masses, tenderness or lesions              Bartholins and Skenes: normal                 Vagina: normal appearing vagina with normal color and discharge, no lesions              Cervix: no lesions              Pap taken: no Bimanual Exam:  Uterus:  normal size, contour, position, consistency, mobility, non-tender              Adnexa: no mass, fullness, tenderness     Chaperone was present for exam:  Warren Lacy, CMA   Assessment: Well woman with Gynecologic exam. Hx mood instability.  On POPs. Intolerant of Yaz. Migraines. Hyperlipidemia.  Long standing elevated TG.   Hx low vit D.   Plan: Mammogram screening age 32 yo. Self breast awareness reviewed. Guidelines for Calcium, Vitamin D, regular exercise program including cardiovascular and weight bearing exercise. Refill of Micronor for one year.  She will contact me if her cycles occur close together.  Pap and HR HPV next year.  Follow up yearly and prn.

## 2023-03-09 ENCOUNTER — Encounter: Payer: Self-pay | Admitting: Obstetrics and Gynecology

## 2023-03-09 ENCOUNTER — Ambulatory Visit (INDEPENDENT_AMBULATORY_CARE_PROVIDER_SITE_OTHER): Payer: MEDICAID | Admitting: Obstetrics and Gynecology

## 2023-03-09 VITALS — BP 126/80 | HR 75 | Ht 64.5 in | Wt 227.0 lb

## 2023-03-09 DIAGNOSIS — Z01419 Encounter for gynecological examination (general) (routine) without abnormal findings: Secondary | ICD-10-CM | POA: Diagnosis not present

## 2023-03-09 MED ORDER — NORETHINDRONE 0.35 MG PO TABS: 1.0000 | ORAL_TABLET | Freq: Every day | ORAL | 3 refills | Status: DC

## 2023-03-09 NOTE — Patient Instructions (Signed)

## 2023-05-26 ENCOUNTER — Encounter: Payer: Self-pay | Admitting: Obstetrics and Gynecology

## 2023-06-01 NOTE — Progress Notes (Signed)
GYNECOLOGY  VISIT   HPI: 28 y.o.   Single  Caucasian female   G0P0000 with No LMP recorded. (Menstrual status: Oral contraceptives).   here for: discuss [facility]- wants to discuss more permanent BC. Mother is present for the visit today.   Taking Micronor. No periods.  No current problems with pills.   Did not do well with Slynd.  Had increased headaches on estrogen containing birth control pills on chart review.  Taking birth control for mood support.  Has PMS.    Sometimes stops POP pills for about 4 days and does ok.  Does this if her PMS symptoms increase.   Not interested in pregnancy for the future.   Not sexually active.   Tamela Oddi, psychiatry  GYNECOLOGIC HISTORY: No LMP recorded. (Menstrual status: Oral contraceptives). Contraception:  POP Menopausal hormone therapy:  n/a Last 2 paps:  02/27/21 neg History of abnormal Pap or positive HPV:  no Mammogram:  n/a        OB History     Gravida  0   Para  0   Term  0   Preterm  0   AB  0   Living  0      SAB  0   IAB  0   Ectopic  0   Multiple  0   Live Births  0              Patient Active Problem List   Diagnosis Date Noted   Intractable migraine with aura with status migrainosus 02/04/2018   Insomnia due to other mental disorder 02/04/2018   Left foot pain 09/04/2016   Fatigue due to depression 06/26/2014   Insomnia secondary to anxiety 05/01/2014   Hypersomnia, periodic 05/01/2014   Recurrent hypersomnia 04/25/2014   Asperger's syndrome 03/01/2014   Insomnia due to medical condition 03/01/2014   Obesity 03/01/2014   Parasomnia overlap disorder 03/01/2014   Asperger syndrome 12/20/2012   Other abnormal glucose 02/10/2011    Past Medical History:  Diagnosis Date   Arthritis 2018   --post traumatic-left leg   Asperger's syndrome    Dermatitis    Dysmenorrhea    Headache    Hyperlipidemia 2018   Menorrhagia    Restless legs syndrome (RLS)     Past Surgical History:   Procedure Laterality Date   TONSILECTOMY/ADENOIDECTOMY WITH MYRINGOTOMY     WISDOM TOOTH EXTRACTION  09/02/12    Current Outpatient Medications  Medication Sig Dispense Refill   cetirizine (ZYRTEC) 10 MG tablet Take 10 mg by mouth daily.     cloNIDine (CATAPRES) 0.1 MG tablet Take 1 tablet (0.1 mg total) by mouth as needed. 90 tablet 1   gabapentin (NEURONTIN) 400 MG capsule Take 1,200 mg by mouth at bedtime.     LAMICTAL 100 MG tablet Take 1.5 tablets by mouth daily.     Melatonin 10 MG CAPS Take 1 tablet by mouth at bedtime.     Multiple Vitamins-Minerals (MULTIVITAMIN GUMMIES ADULT PO) Take 1 tablet by mouth daily.     norethindrone (MICRONOR) 0.35 MG tablet Take 1 tablet (0.35 mg total) by mouth daily. 84 tablet 3   PREVIDENT 5000 BOOSTER PLUS 1.1 % PSTE   6   Vilazodone HCl (VIIBRYD) 40 MG TABS Take by mouth.     No current facility-administered medications for this visit.     ALLERGIES: Corn-containing products, Doxycycline, Latex, and Peanut-containing drug products  Family History  Problem Relation Age of Onset   Hypertension  Father    Kidney Stones Mother    Kidney Stones Sister    Thyroid disease Maternal Grandmother    Depression Maternal Grandmother    Diabetes Maternal Grandfather    CVA Maternal Grandfather    Cancer Maternal Grandfather        unknown primary, ? melanoma   Diabetes Paternal Grandmother    Hypertension Paternal Grandmother    Diabetes Paternal Grandfather    Hypertension Paternal Grandfather    Cancer Paternal Grandfather        leukemia    Social History   Socioeconomic History   Marital status: Single    Spouse name: Not on file   Number of children: 0   Years of education: 12   Highest education level: Not on file  Occupational History   Occupation: n/a  Tobacco Use   Smoking status: Never   Smokeless tobacco: Never  Vaping Use   Vaping status: Never Used  Substance and Sexual Activity   Alcohol use: No   Drug use: No    Sexual activity: Never    Birth control/protection: Pill    Comment: LoLoestrin--continuously  Other Topics Concern   Not on file  Social History Narrative   Lives with mother and father    Caffeine use: rarely (lemonade powder mix)    Right handed, Caffeine barely, HS grad.  Looking for work, Lives at home.     Social Drivers of Corporate investment banker Strain: Low Risk  (12/10/2022)   Received from [facility]   Overall Financial Resource Strain (CARDIA)    Difficulty of Paying Living Expenses: Not hard at all  Food Insecurity: No Food Insecurity (12/10/2022)   Received from [facility]   Hunger Vital Sign    Worried About Running Out of Food in the Last Year: Never true    Ran Out of Food in the Last Year: Never true  Transportation Needs: No Transportation Needs (12/10/2022)   Received from [facility] - [facility]    Lack of Transportation (Medical): No    Lack of Transportation (Non-Medical): No  Physical Activity: Sufficiently Active (12/10/2022)   Received from [facility]   Exercise Vital Sign    Days of Exercise per Week: 7 days    Minutes of Exercise per Session: 30 min  Stress: No Stress Concern Present (12/10/2022)   Received from [facility] - Occupational Stress Questionnaire    Feeling of Stress : Only a little  Social Connections: Moderately Integrated (12/10/2022)   [facility]   Social Network    How would you rate your social network (family, work, friends)?: Adequate participation with social networks  Intimate Partner Violence: Unknown (08/08/2021)   Received from [facility]   HITS    Physically Hurt: Not on file    Insult or Talk Down To: Not on file    Threaten Physical Harm: Not on file    Scream or Curse: Not on file    Review of Systems  All other systems reviewed and are negative.   PHYSICAL EXAMINATION:   BP 132/76 (BP Location: Right Arm, Patient  Position: Sitting, Cuff Size: Small)   Pulse 72   Ht 5' 4.5" (1.638 m)   Wt 232 lb (105.2 kg)   SpO2 99%   BMI 39.21 kg/m     General appearance: alert, cooperative and appears stated age  ASSESSMENT:  On progesterone only birth  control pills for mood support and stabilization.  PMS. Migraines.  Not currently menstrual in nature.  Not certain if she has aura.   PLAN:  We discussed options for progesterone based birth control, all IUDs, and permanent contraception.  Risks and benefits of IUDS, Nexplanon, Depo Provera, Micronor, OPill, and permanent sterilization reviewed.  We discussed the contraceptive and noncontraceptive benefits of hormonal containing contraception.  Pamphlet given on ParaGard.  The patient may contact psychiatrist regarding a trial of stopping her POPs.  If patient desires to proceed with Paragard, I would recommend an office visit to review this in detail before scheduling the insertion.    35 min  total time was spent for this patient encounter, including preparation, face-to-face counseling with the patient, coordination of care, and documentation of the encounter.

## 2023-06-15 ENCOUNTER — Ambulatory Visit (INDEPENDENT_AMBULATORY_CARE_PROVIDER_SITE_OTHER): Payer: MEDICAID | Admitting: Obstetrics and Gynecology

## 2023-06-15 ENCOUNTER — Encounter: Payer: Self-pay | Admitting: Obstetrics and Gynecology

## 2023-06-15 VITALS — BP 132/76 | HR 72 | Ht 64.5 in | Wt 232.0 lb

## 2023-06-15 DIAGNOSIS — Z308 Encounter for other contraceptive management: Secondary | ICD-10-CM

## 2023-12-31 ENCOUNTER — Other Ambulatory Visit (HOSPITAL_BASED_OUTPATIENT_CLINIC_OR_DEPARTMENT_OTHER): Payer: Self-pay

## 2023-12-31 ENCOUNTER — Ambulatory Visit (INDEPENDENT_AMBULATORY_CARE_PROVIDER_SITE_OTHER): Payer: MEDICAID | Admitting: Physician Assistant

## 2023-12-31 ENCOUNTER — Ambulatory Visit (INDEPENDENT_AMBULATORY_CARE_PROVIDER_SITE_OTHER): Payer: MEDICAID

## 2023-12-31 DIAGNOSIS — M79641 Pain in right hand: Secondary | ICD-10-CM

## 2023-12-31 MED ORDER — NAPROXEN 500 MG PO TABS
500.0000 mg | ORAL_TABLET | Freq: Two times a day (BID) | ORAL | 0 refills | Status: AC
  Filled 2023-12-31: qty 28, 14d supply, fill #0

## 2023-12-31 NOTE — Progress Notes (Signed)
 Chief Complaint: Right hand pain     History of Present Illness:    Tina Wilcox is a 28 y.o. female presents to [facility] two weeks status post right hand injury. Patient struck right fist, ulnar aspect, against her fridge on Wed. August 13th. States she was venting some frustration/having a mental healthy/anxiety moment. Pain located along dorsal aspect of 5th metacarpal, 5th MCP, and at hypothenar eminence. Reports pain with curving hand or holding something against palm. Reports associated weakness; however, feels secondary to pain. Has applied ice and taken OTC ibuprofen (intermittently) with minimal improvement. Denies radiating pain, hand numbness or paresthesias. Patient is right hand dominant. No previous history of right hand fracture. Patient with possible undiagnosed connective tissue disorder; reports frequent ligament issues/sprains. Mother with same condition. Patient works/hobby as an Tree surgeon; hand injury impacting fine motor work.    PMH/PSH/Family History/Social History/Meds/Allergies:    Past Medical History:  Diagnosis Date   Arthritis 2018   --post traumatic-left leg   Asperger's syndrome    Dermatitis    Dysmenorrhea    Headache    Hyperlipidemia 2018   Menorrhagia    Restless legs syndrome (RLS)    Past Surgical History:  Procedure Laterality Date   TONSILECTOMY/ADENOIDECTOMY WITH MYRINGOTOMY     WISDOM TOOTH EXTRACTION  09/02/12   Social History   Socioeconomic History   Marital status: Single    Spouse name: Not on file   Number of children: 0   Years of education: 12   Highest education level: Not on file  Occupational History   Occupation: n/a  Tobacco Use   Smoking status: Never   Smokeless tobacco: Never  Vaping Use   Vaping status: Never Used  Substance and Sexual Activity   Alcohol use: No   Drug use: No   Sexual activity: Never    Birth control/protection: Pill    Comment: LoLoestrin--continuously  Other Topics Concern   Not on  file  Social History Narrative   Lives with mother and father    Caffeine use: rarely (lemonade powder mix)    Right handed, Caffeine barely, HS grad.  Looking for work, Lives at home.     Social Drivers of Corporate investment banker Strain: Low Risk  (12/13/2023)   Received from [facility]   Overall Financial Resource Strain (CARDIA)    How hard is it for you to pay for the very basics like food, housing, medical care, and heating?: Not very hard  Food Insecurity: No Food Insecurity (12/13/2023)   Received from [facility]   Hunger Vital Sign    Within the past 12 months, you worried that your food would run out before you got the money to buy more.: Never true    Within the past 12 months, the food you bought just didn't last and you didn't have money to get more.: Never true  Transportation Needs: No Transportation Needs (12/13/2023)   Received from [facility] - Transportation    In the past 12 months, has lack of transportation kept you from medical appointments or from getting medications?: No    In the past 12 months, has lack of transportation kept you from meetings, work, or from getting things needed for daily living?: No  Physical Activity: Sufficiently Active (12/13/2023)   Received from [facility]   Exercise Vital Sign    On average, how many days per week do you engage in moderate to strenuous exercise (like a brisk  walk)?: 7 days    On average, how many minutes do you engage in exercise at this level?: 30 min  Stress: Stress Concern Present (12/13/2023)   Received from [facility] - Occupational Stress Questionnaire    Do you feel stress - tense, restless, nervous, or anxious, or unable to sleep at night because your mind is troubled all the time - these days?: Rather much  Social Connections: Moderately Integrated (12/13/2023)   Received from [facility]   Social Network    How would you rate your social  network (family, work, friends)?: Adequate participation with social networks   Family History  Problem Relation Age of Onset   Hypertension Father    Kidney Stones Mother    Kidney Stones Sister    Thyroid  disease Maternal Grandmother    Depression Maternal Grandmother    Diabetes Maternal Grandfather    CVA Maternal Grandfather    Cancer Maternal Grandfather        unknown primary, ? melanoma   Diabetes Paternal Grandmother    Hypertension Paternal Grandmother    Diabetes Paternal Grandfather    Hypertension Paternal Grandfather    Cancer Paternal Grandfather        leukemia   Allergies  Allergen Reactions   Corn-Containing Products Nausea And Vomiting   Doxycycline Hives    Multiple GI, Yeast, lymph node swelling, HA Multiple GI, Yeast, lymph node swelling, HA   Latex Rash   Peanut-Containing Drug Products Nausea Only   Current Outpatient Medications  Medication Sig Dispense Refill   cetirizine (ZYRTEC) 10 MG tablet Take 10 mg by mouth daily.     cloNIDine  (CATAPRES ) 0.1 MG tablet Take 1 tablet (0.1 mg total) by mouth as needed. 90 tablet 1   gabapentin  (NEURONTIN ) 400 MG capsule Take 1,200 mg by mouth at bedtime.     LAMICTAL 100 MG tablet Take 1.5 tablets by mouth daily.     Melatonin 10 MG CAPS Take 1 tablet by mouth at bedtime.     Multiple Vitamins-Minerals (MULTIVITAMIN GUMMIES ADULT PO) Take 1 tablet by mouth daily.     norethindrone  (MICRONOR ) 0.35 MG tablet Take 1 tablet (0.35 mg total) by mouth daily. 84 tablet 3   PREVIDENT 5000 BOOSTER PLUS 1.1 % PSTE   6   Vilazodone HCl (VIIBRYD) 40 MG TABS Take by mouth.     No current facility-administered medications for this visit.   No results found.  Review of Systems:   A ROS was performed including pertinent positives and negatives as documented in the HPI.  Physical Exam :   Constitutional: NAD and appears stated age Neurological: Alert and oriented Psych: Appropriate affect and cooperative There were no  vitals taken for this visit.   Comprehensive Musculoskeletal Exam:    Right hand inspection reveals no erythema, edema or ecchymosis. No gross deformity. Mild tenderness with palpation along hypothenar eminence and at 5th MCP. Full ROM. 5/5 motor strength. Mild pain with firm hand grip. Mild pain with ulnar deviation at wrist. Strong radial pulse. Sensation intact. Brisk capillary refill.   Imaging:   Xray (Right hand complete): No fracture or dislocation. No evident arthropathy.   I personally reviewed and interpreted the radiographs.   Assessment and Plan:   28 y.o. female presents two weeks status post right hand injury. Patient struck ulnar aspect of hand against fridge. Pain at 5th MCP and hypothenar eminence. Negative right hand xray. Prescribed Naproxen  bid x 2 weeks.  Discussed NSAID precautions. Recommended patient return in two weeks if no significant improvement. If patient returns, discussed possibility of MRI vs referral to hand specialist or EMG (based on predominant symptoms/concerns). Also, discussed possible referral to rheumatologist for further evaluation of possible connective tissue disorder.   I personally saw and evaluated the patient, and participated in the management and treatment plan.  Lucie Stabs, PA-C Orthopedic Surgery  This document was dictated using Dragon voice recognition software. A reasonable attempt at proof reading has been made to minimize errors.

## 2024-02-21 ENCOUNTER — Other Ambulatory Visit: Payer: Self-pay | Admitting: Obstetrics and Gynecology

## 2024-02-22 NOTE — Telephone Encounter (Addendum)
 Med refill request: norethindrone  (MICRONOR ) 0.35 MG tablet  Start:  03/09/23 Disp:  84 tablets Refills:  0  Last AEX: 03/09/23 Next AEX:  03/09/24 Last MMG (if hormonal med):  Not yet Refill authorized? Please Advise.

## 2024-03-07 ENCOUNTER — Encounter: Payer: Self-pay | Admitting: Radiology

## 2024-03-09 ENCOUNTER — Ambulatory Visit: Payer: MEDICAID | Admitting: Obstetrics and Gynecology

## 2024-05-27 ENCOUNTER — Other Ambulatory Visit: Payer: Self-pay | Admitting: Obstetrics and Gynecology

## 2024-05-27 NOTE — Telephone Encounter (Signed)
 Med refill request: norethindrone  (Micronor ) 0.35 mg tablet Last AEX: 03/09/23 BS Next AEX: 06/20/24 BS Last MMG (if hormonal med) N/A Refill authorized: Please Advise? Last Rx sent #[id number] with zero refills on 02/22/24 [facility]

## 2024-06-20 ENCOUNTER — Ambulatory Visit: Payer: MEDICAID | Admitting: Obstetrics and Gynecology
